# Patient Record
Sex: Female | Born: 1990 | Race: White | Hispanic: No | Marital: Married | Smoking: Never smoker
Health system: Southern US, Community
[De-identification: ages and names within clinical notes are randomized; demographics above are authoritative.]

## PROBLEM LIST (undated history)

## (undated) ENCOUNTER — Inpatient Hospital Stay (HOSPITAL_COMMUNITY): Payer: Self-pay

## (undated) DIAGNOSIS — E282 Polycystic ovarian syndrome: Secondary | ICD-10-CM

## (undated) DIAGNOSIS — F32A Depression, unspecified: Secondary | ICD-10-CM

## (undated) DIAGNOSIS — B009 Herpesviral infection, unspecified: Secondary | ICD-10-CM

## (undated) DIAGNOSIS — F329 Major depressive disorder, single episode, unspecified: Secondary | ICD-10-CM

## (undated) DIAGNOSIS — O2300 Infections of kidney in pregnancy, unspecified trimester: Secondary | ICD-10-CM

## (undated) DIAGNOSIS — Z8619 Personal history of other infectious and parasitic diseases: Secondary | ICD-10-CM

## (undated) HISTORY — DX: Polycystic ovarian syndrome: E28.2

## (undated) HISTORY — PX: WISDOM TOOTH EXTRACTION: SHX21

## (undated) HISTORY — DX: Personal history of other infectious and parasitic diseases: Z86.19

---
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
## 2011-04-18 LAB — RPR: RPR Ser Ql: NONREACTIVE

## 2011-04-18 LAB — BASIC METABOLIC PANEL
CO2: 24
Calcium: 9
Creatinine, Ser: 0.65
Glucose, Bld: 85

## 2011-04-18 LAB — CBC
MCV: 81.8
RBC: 4.4
WBC: 11

## 2011-04-18 LAB — DRUGS OF ABUSE SCREEN W/O ALC, ROUTINE URINE
Barbiturate Quant, Ur: NEGATIVE
Benzodiazepines.: NEGATIVE
Cocaine Metabolites: NEGATIVE
Methadone: NEGATIVE
Phencyclidine (PCP): NEGATIVE

## 2011-04-18 LAB — URINALYSIS, ROUTINE W REFLEX MICROSCOPIC
Bilirubin Urine: NEGATIVE
Glucose, UA: NEGATIVE
Hgb urine dipstick: NEGATIVE
Ketones, ur: NEGATIVE
pH: 7

## 2011-04-18 LAB — GAMMA GT: GGT: 15

## 2011-04-18 LAB — HEPATIC FUNCTION PANEL
ALT: 17
AST: 17
Bilirubin, Direct: 0.1
Total Protein: 7.1

## 2011-04-18 LAB — LIPID PANEL
HDL: 46
Total CHOL/HDL Ratio: 4
Triglycerides: 133
VLDL: 27

## 2011-04-18 LAB — DIFFERENTIAL
Eosinophils Absolute: 0.1
Lymphs Abs: 3.5
Monocytes Relative: 8
Neutrophils Relative %: 58

## 2011-04-18 LAB — URINE MICROSCOPIC-ADD ON

## 2011-04-19 LAB — URINALYSIS, ROUTINE W REFLEX MICROSCOPIC
Bilirubin Urine: NEGATIVE
Bilirubin Urine: NEGATIVE
Glucose, UA: NEGATIVE
Glucose, UA: NEGATIVE
Hgb urine dipstick: NEGATIVE
Hgb urine dipstick: NEGATIVE
Hgb urine dipstick: NEGATIVE
Protein, ur: NEGATIVE
Specific Gravity, Urine: 1.005
Specific Gravity, Urine: 1.015
Urobilinogen, UA: 0.2
pH: 7.5

## 2011-04-19 LAB — PROTIME-INR: Prothrombin Time: 13.3

## 2011-04-19 LAB — POCT I-STAT 3, ART BLOOD GAS (G3+)
O2 Saturation: 99
pCO2 arterial: 27.7 — ABNORMAL LOW
pH, Arterial: 7.46 — ABNORMAL HIGH
pO2, Arterial: 112 — ABNORMAL HIGH

## 2011-04-19 LAB — BASIC METABOLIC PANEL
BUN: 6
BUN: 9
CO2: 21
CO2: 25
CO2: 26
CO2: 26
Calcium: 8.2 — ABNORMAL LOW
Calcium: 8.2 — ABNORMAL LOW
Calcium: 8.4
Chloride: 108
Creatinine, Ser: 0.77
Creatinine, Ser: 0.77
Glucose, Bld: 105 — ABNORMAL HIGH
Glucose, Bld: 83
Glucose, Bld: 96
Glucose, Bld: 98
Potassium: 3.5
Potassium: 4.2
Sodium: 140
Sodium: 140
Sodium: 144

## 2011-04-19 LAB — RAPID URINE DRUG SCREEN, HOSP PERFORMED
Barbiturates: NOT DETECTED
Cocaine: NOT DETECTED
Opiates: NOT DETECTED
Tetrahydrocannabinol: NOT DETECTED

## 2011-04-19 LAB — COMPREHENSIVE METABOLIC PANEL
ALT: 25
AST: 29
Alkaline Phosphatase: 64
CO2: 22
Calcium: 8 — ABNORMAL LOW
Chloride: 111
Glucose, Bld: 102 — ABNORMAL HIGH
Potassium: 3.5
Sodium: 142

## 2011-04-19 LAB — DIFFERENTIAL
Basophils Relative: 0
Eosinophils Absolute: 0.1
Eosinophils Relative: 0
Monocytes Relative: 5
Neutrophils Relative %: 82 — ABNORMAL HIGH

## 2011-04-19 LAB — CBC
HCT: 37.8
MCHC: 34.8
MCV: 80.6
Platelets: 527 — ABNORMAL HIGH
RBC: 4.68

## 2011-04-19 LAB — URINE MICROSCOPIC-ADD ON

## 2011-04-19 LAB — SALICYLATE LEVEL
Salicylate Lvl: 31.8
Salicylate Lvl: 37.9

## 2011-04-19 LAB — POCT PREGNANCY, URINE: Preg Test, Ur: NEGATIVE

## 2011-04-19 LAB — MAGNESIUM: Magnesium: 2.4

## 2011-04-26 LAB — PREGNANCY, URINE: Preg Test, Ur: NEGATIVE

## 2011-04-26 LAB — DIFFERENTIAL
Basophils Absolute: 0
Basophils Relative: 0
Eosinophils Absolute: 0.1
Eosinophils Relative: 1
Monocytes Absolute: 0.5
Neutro Abs: 6.3

## 2011-04-26 LAB — CBC
HCT: 39
Hemoglobin: 13.3
Platelets: 456 — ABNORMAL HIGH
WBC: 9.1

## 2011-04-26 LAB — COMPREHENSIVE METABOLIC PANEL
ALT: 34
AST: 23
Albumin: 3.8
Alkaline Phosphatase: 68
BUN: 13
Chloride: 108
Potassium: 4
Sodium: 141
Total Bilirubin: 0.5

## 2011-04-26 LAB — ETHANOL: Alcohol, Ethyl (B): <5

## 2011-04-26 LAB — RAPID URINE DRUG SCREEN, HOSP PERFORMED: Benzodiazepines: NOT DETECTED

## 2011-06-27 LAB — OB RESULTS CONSOLE ANTIBODY SCREEN: Antibody Screen: NEGATIVE

## 2011-06-27 LAB — OB RESULTS CONSOLE RPR: RPR: NONREACTIVE

## 2011-06-27 LAB — OB RESULTS CONSOLE RUBELLA ANTIBODY, IGM: Rubella: IMMUNE

## 2011-06-27 LAB — OB RESULTS CONSOLE PLATELET COUNT: Platelets: 399 10*3/uL

## 2011-07-11 LAB — OB RESULTS CONSOLE GC/CHLAMYDIA
Chlamydia: NEGATIVE
Gonorrhea: NEGATIVE

## 2011-07-25 DIAGNOSIS — O2300 Infections of kidney in pregnancy, unspecified trimester: Secondary | ICD-10-CM

## 2011-07-25 HISTORY — DX: Infections of kidney in pregnancy, unspecified trimester: O23.00

## 2011-07-25 NOTE — L&D Delivery Note (Signed)
Delivery Note Pt pushed well with UCs.  At 0902 am  a viable female was delivered via  (Presentation:LOA;Vtx ). No nuchal cord.  No difficulty with shoulders.  Infant with spontaneous cry.  Infant dried, stimulated and placed on maternal chest.    APGAR: 9,9 ; weight pending .   Placenta status: intact.  Cord: 3VC with the following complications:none.  Cord pH: N/A  Anesthesia:Epidural   Episiotomy: None Lacerations: Bilateral labial abrasions, hemostatic Suture Repair: None Est. Blood Loss (mL): 250  Mom to postpartum.  Baby to nursery-stable.  Tonya Ellis O. 02/03/2012, 10:51 AM

## 2011-09-22 ENCOUNTER — Encounter (HOSPITAL_COMMUNITY): Payer: Self-pay | Admitting: *Deleted

## 2011-09-22 ENCOUNTER — Inpatient Hospital Stay (HOSPITAL_COMMUNITY)
Admission: AD | Admit: 2011-09-22 | Discharge: 2011-09-22 | Disposition: A | Discharge status: Home Health | Payer: Medicaid Other | Source: Ambulatory Visit | Attending: Obstetrics & Gynecology | Admitting: Obstetrics & Gynecology

## 2011-09-22 DIAGNOSIS — N949 Unspecified condition associated with female genital organs and menstrual cycle: Secondary | ICD-10-CM

## 2011-09-22 DIAGNOSIS — N760 Acute vaginitis: Secondary | ICD-10-CM

## 2011-09-22 DIAGNOSIS — O26899 Other specified pregnancy related conditions, unspecified trimester: Secondary | ICD-10-CM

## 2011-09-22 DIAGNOSIS — O99891 Other specified diseases and conditions complicating pregnancy: Secondary | ICD-10-CM | POA: Insufficient documentation

## 2011-09-22 DIAGNOSIS — R109 Unspecified abdominal pain: Secondary | ICD-10-CM

## 2011-09-22 LAB — URINALYSIS, ROUTINE W REFLEX MICROSCOPIC
Bilirubin Urine: NEGATIVE
Hgb urine dipstick: NEGATIVE
Ketones, ur: NEGATIVE mg/dL
Nitrite: NEGATIVE
Protein, ur: NEGATIVE mg/dL
Urobilinogen, UA: 0.2 mg/dL (ref 0.0–1.0)

## 2011-09-22 LAB — WET PREP, GENITAL

## 2011-09-22 MED ORDER — METRONIDAZOLE 500 MG PO TABS: 500.0000 mg | ORAL_TABLET | Freq: Two times a day (BID) | ORAL | Status: AC

## 2011-09-22 NOTE — Progress Notes (Signed)
Pt states, " I have had twinges in my lower abdomen for two days. Sometimes they stop me in my tracks."

## 2011-09-22 NOTE — Progress Notes (Signed)
Notified [facility], she states that she isn't their patient yet. Dr Joice Lofts notified of patient her complaints and her family request for ultrasound. Will see patient in MAU as soon as possible

## 2011-09-22 NOTE — ED Provider Notes (Signed)
History     Chief Complaint  Patient presents with  . Abdominal Cramping   HPI Patient is a 21 yo G1P0 at [redacted]w[redacted]d presenting to the MAU for cramps and twinges for 2 days. Last prenatal care in January in [place]. Has appointment for nurse appointment at [facility] on March 25. Cramps come on a random times, no set pattern. Has not had anything like this earlier in pregnancy. Describes cramping as squeezing pain. (Pain is less than her regular menstrual cramps) Dates established by ultrasound at 6 weeks. Regular prenatal care until she moved in January. No bleeding, no discharge, good fetal movement.   OB History    Grav Para Term Preterm Abortions TAB SAB Ect Mult Living   1              History reviewed. No pertinent past medical history.  History reviewed. No pertinent past surgical history.  History reviewed. No pertinent family history.  History  Substance Use Topics  . Smoking status: Passive Smoker  . Smokeless tobacco: Not on file  . Alcohol Use: No    Allergies: Not on File  Prescriptions prior to admission  Medication Sig Dispense Refill  . Prenatal Vit-Fe Fumarate-FA (PRENATAL MULTIVITAMIN) TABS Take 1 tablet by mouth daily.        Review of Systems  Constitutional: Negative for fever and chills.  Eyes: Negative for blurred vision.  Respiratory: Negative for shortness of breath.   Cardiovascular: Negative for chest pain.  Gastrointestinal: Negative for abdominal pain, diarrhea and constipation.  Genitourinary: Negative for dysuria.  Skin: Negative for rash.  Neurological: Positive for headaches.  Psychiatric/Behavioral: Negative for depression.   Physical Exam   Blood pressure 120/62, pulse 77, temperature 98.1 F (36.7 C), temperature source Oral, resp. rate 18, height 5\' 4"  (1.626 m), weight 88.451 kg (195 lb).  Physical Exam  Constitutional: She appears well-developed and well-nourished. No distress.  HENT:  Head: Normocephalic and atraumatic.  Eyes:  Pupils are equal, round, and reactive to light.  Neck: Normal range of motion.  Cardiovascular: Normal rate, regular rhythm and normal heart sounds.   Respiratory: Effort normal and breath sounds normal.  GI: Soft. There is no tenderness.       Gravid. Fundal Height 20cm  Genitourinary: Vagina normal.       Thick white discharge. No lesions noted.  Cervical exam: closed and thick  Musculoskeletal: Normal range of motion. She exhibits no edema and no tenderness.  Neurological: She is alert.  Skin: Skin is warm and dry.    MAU Course  Procedures  Results for orders placed during the hospital encounter of 09/22/11 (from the past 24 hour(s))  URINALYSIS, ROUTINE W REFLEX MICROSCOPIC     Status: Normal   Collection Time   09/22/11  7:57 PM      Component Value Range   Color, Urine YELLOW  YELLOW    APPearance CLEAR  CLEAR    Specific Gravity, Urine 1.015  1.005 - 1.030    pH 6.0  5.0 - 8.0    Glucose, UA NEGATIVE  NEGATIVE (mg/dL)   Hgb urine dipstick NEGATIVE  NEGATIVE    Bilirubin Urine NEGATIVE  NEGATIVE    Ketones, ur NEGATIVE  NEGATIVE (mg/dL)   Protein, ur NEGATIVE  NEGATIVE (mg/dL)   Urobilinogen, UA 0.2  0.0 - 1.0 (mg/dL)   Nitrite NEGATIVE  NEGATIVE    Leukocytes, UA NEGATIVE  NEGATIVE   WET PREP, GENITAL     Status: Abnormal  Collection Time   09/22/11 10:08 PM      Component Value Range   Yeast Wet Prep HPF POC NONE SEEN  NONE SEEN    Trich, Wet Prep NONE SEEN  NONE SEEN    Clue Cells Wet Prep HPF POC MODERATE (*) NONE SEEN    WBC, Wet Prep HPF POC FEW (*) NONE SEEN    FHT by doppler WNL Cervical exam: Closed/thick  Assessment and Plan  21 yo G1P0 at [redacted]w[redacted]d presenting for 2 day history of irregular cramping in lower abdomen - No distress, cramping not a regular pattern. FHT reassuring. Exam reassuring. - No sign of a UTI which would be causing her discomfort - Wet prep shows: Moderate Clue Cells - Given Rx for Flagyl 500mg  PO BID for 7 days for BV. - Patient will  follow up with [facility] on March 25, as scheduled.  - Will be discharged home with preterm labor precautions. Plan discussed, and patient examined with Dorathy Kinsman, CNM.  Yoanna Jurczyk 09/22/2011, 10:49 PM

## 2011-10-16 ENCOUNTER — Encounter (INDEPENDENT_AMBULATORY_CARE_PROVIDER_SITE_OTHER): Payer: Medicaid Other

## 2011-10-16 DIAGNOSIS — Z331 Pregnant state, incidental: Secondary | ICD-10-CM

## 2011-10-23 ENCOUNTER — Encounter (INDEPENDENT_AMBULATORY_CARE_PROVIDER_SITE_OTHER): Payer: Medicaid Other | Admitting: Registered Nurse

## 2011-10-23 ENCOUNTER — Encounter (INDEPENDENT_AMBULATORY_CARE_PROVIDER_SITE_OTHER): Payer: Medicaid Other

## 2011-10-23 DIAGNOSIS — Z1389 Encounter for screening for other disorder: Secondary | ICD-10-CM

## 2011-10-23 DIAGNOSIS — Z363 Encounter for antenatal screening for malformations: Secondary | ICD-10-CM

## 2011-10-23 DIAGNOSIS — Z331 Pregnant state, incidental: Secondary | ICD-10-CM

## 2011-10-26 ENCOUNTER — Encounter (INDEPENDENT_AMBULATORY_CARE_PROVIDER_SITE_OTHER): Payer: Medicaid Other | Admitting: Registered Nurse

## 2011-10-26 DIAGNOSIS — Z331 Pregnant state, incidental: Secondary | ICD-10-CM

## 2011-11-09 ENCOUNTER — Encounter (HOSPITAL_COMMUNITY): Payer: Self-pay

## 2011-11-09 ENCOUNTER — Telehealth: Payer: Self-pay | Admitting: Obstetrics and Gynecology

## 2011-11-09 ENCOUNTER — Inpatient Hospital Stay (HOSPITAL_COMMUNITY)
Admission: AD | Admit: 2011-11-09 | Discharge: 2011-11-09 | Disposition: A | Discharge status: Home / Self Care | Payer: Medicaid Other | Source: Ambulatory Visit | Attending: Obstetrics and Gynecology | Admitting: Obstetrics and Gynecology

## 2011-11-09 DIAGNOSIS — N898 Other specified noninflammatory disorders of vagina: Secondary | ICD-10-CM

## 2011-11-09 DIAGNOSIS — Z331 Pregnant state, incidental: Secondary | ICD-10-CM

## 2011-11-09 DIAGNOSIS — O99891 Other specified diseases and conditions complicating pregnancy: Secondary | ICD-10-CM | POA: Insufficient documentation

## 2011-11-09 DIAGNOSIS — O99340 Other mental disorders complicating pregnancy, unspecified trimester: Secondary | ICD-10-CM

## 2011-11-09 HISTORY — DX: Major depressive disorder, single episode, unspecified: F32.9

## 2011-11-09 HISTORY — DX: Depression, unspecified: F32.A

## 2011-11-09 LAB — URINALYSIS, ROUTINE W REFLEX MICROSCOPIC
Leukocytes, UA: NEGATIVE
Nitrite: NEGATIVE
Specific Gravity, Urine: 1.02 (ref 1.005–1.030)
Urobilinogen, UA: 0.2 mg/dL (ref 0.0–1.0)
pH: 6 (ref 5.0–8.0)

## 2011-11-09 LAB — WET PREP, GENITAL: Clue Cells Wet Prep HPF POC: NONE SEEN

## 2011-11-09 NOTE — MAU Note (Signed)
Mucous plug noted around1530-1600. Urge to pee since then, no bleeding, no leaking.

## 2011-11-09 NOTE — MAU Note (Signed)
Pt states last intercourse yesterday, noted mucusy vaginal d/c today at 1540. Denies vaginal bleeding. Having intermittent back pain x2 days.

## 2011-11-09 NOTE — MAU Provider Note (Signed)
History   Tonya Ellis is a 21y.o. WF at 27.2 weeks who presents for r/o ROM with CC of large amt of mucousy d/c noted around 1545 today.  Intercourse last night, but no lubricant used.  No VB in mucous or otherwise.  No irritation or odor to d/c.  Mild intermittent cramping a "few times" yesterday, but none today.  Denies any tightening or ctxs.  No UTI or PIH s/s.  Very active fetus.  No resp or GI c/o's.  BM's regular.  Not employed and not in school, and just sitting around at home today before coming to MAU w/ her mom and younger sister. Pt denies any gushes of fluid and did not wear a pad into MAU. Pregnancy r/f: 1.  Late to care 2.  Obese 3.  H/o bipolar--no meds currently  CSN: [id number]  Arrival date and time: 11/09/11 1631   First Provider Initiated Contact with Patient 11/09/11 1742      Chief Complaint  Patient presents with  . lost mucous    HPI  OB History    Grav Para Term Preterm Abortions TAB SAB Ect Mult Living   1               Past Medical History  Diagnosis Date  . Depression     No past surgical history on file.  Family History  Problem Relation Age of Onset  . Anesthesia problems Neg Hx     History  Substance Use Topics  . Smoking status: Passive Smoker  . Smokeless tobacco: Never Used  . Alcohol Use: No    Allergies: No Known Allergies  Prescriptions prior to admission  Medication Sig Dispense Refill  . Prenatal Vit-Fe Fumarate-FA (PRENATAL MULTIVITAMIN) TABS Take 1 tablet by mouth at bedtime.         ROS--see history above Physical Exam   Blood pressure 99/72, pulse 94, temperature 98 F (36.7 C), temperature source Oral, resp. rate 20, height 5\' 4"  (1.626 m), weight 195 lb 6.4 oz (88.633 kg). .. Results for orders placed during the hospital encounter of 11/09/11 (from the past 24 hour(s))  AMNISURE RUPTURE OF MEMBRANE (ROM)     Status: Normal   Collection Time   11/09/11  6:00 PM      Component Value Range   Amnisure ROM  NEGATIVE    WET PREP, GENITAL     Status: Abnormal   Collection Time   11/09/11  6:00 PM      Component Value Range   Yeast Wet Prep HPF POC NONE SEEN  NONE SEEN    Trich, Wet Prep NONE SEEN  NONE SEEN    Clue Cells Wet Prep HPF POC NONE SEEN  NONE SEEN    WBC, Wet Prep HPF POC FEW (*) NONE SEEN    Physical Exam  Constitutional: She is oriented to person, place, and time. She appears well-developed and well-nourished. No distress.       Hair dyed bright red; glasses  Cardiovascular: Normal rate.   Respiratory: Effort normal.  GI: Soft.       gravid  Genitourinary: Vagina normal.       Mons and labia--shaved; Perineum and vulva dry.  Neg pooling in vault. Cx:  Closed/long/high/medium firmness  Neurological: She is alert and oriented to person, place, and time.  Skin: Skin is warm and dry.    MAU Course  Procedures 1.  NST 2.  Wet prep 3.  Gc/ct (negative on 09/22/11) 4.  amnisure  Assessment and Plan  1.  IUP at 27.2 2.  mucousy d/c this afternoon; may be part of mucous plug or r/t intercourse last night 3.  No s/s of labor 4.  Reactive NST 5.  Negative amnisure 6.  Bipolar  1.  D/c home w/ PTL precautions 2.  F/u as previously scheduled or prn  [place] H 11/09/2011, 6:29 PM

## 2011-11-09 NOTE — Discharge Instructions (Signed)
Preventing Preterm Labor Preterm labor is when a pregnant woman has contractions that cause the cervix to open, shorten, and thin before 37 weeks of pregnancy. You will have regular contractions (tightening) 2 to 3 minutes apart. This usually causes discomfort or pain. HOME CARE  Eat a healthy diet.   Take your vitamins as told by your doctor.   Drink enough fluids to keep your pee (urine) clear or pale yellow every day.   Get rest and sleep.   Do not have sex if you are at high risk for preterm labor.   Follow your doctor's advice about activity, medicines, and tests.   Avoid stress.   Avoid hard labor or exercise that lasts for a long time.   Do not smoke.  GET HELP RIGHT AWAY IF:   You are having contractions.   You have belly (abdominal) pain.   You have bleeding from your vagina.   You have pain when you pee (urinate).   You have abnormal discharge from your vagina.   You have a temperature by mouth above 102 F (38.9 C).  MAKE SURE YOU:  Understand these instructions.   Will watch your condition.   Will get help if you are not doing well or get worse.  Document Released: 10/06/2008 Document Revised: 06/29/2011 Document Reviewed: 10/06/2008 [employer] Patient Information 2012 [employer]. 

## 2011-11-09 NOTE — Telephone Encounter (Signed)
Spoke with pt.   States just passed lg amt mucousy D/C.   No bleeding.  +FM.   Occ cont but unsure ho frequently.  Feels as though is leaking and underwear feels damp.  Consult w/[facility].   Pt instructed to go to MAU.  Pt verbalizes comprehension.

## 2011-11-09 NOTE — Telephone Encounter (Signed)
Routed to nancy °

## 2011-11-10 LAB — GC/CHLAMYDIA PROBE AMP, GENITAL: Chlamydia, DNA Probe: NEGATIVE

## 2011-11-14 ENCOUNTER — Ambulatory Visit (INDEPENDENT_AMBULATORY_CARE_PROVIDER_SITE_OTHER): Payer: Medicaid Other | Admitting: Obstetrics and Gynecology

## 2011-11-14 ENCOUNTER — Encounter: Payer: Self-pay | Admitting: Obstetrics and Gynecology

## 2011-11-14 ENCOUNTER — Other Ambulatory Visit: Payer: Medicaid Other

## 2011-11-14 ENCOUNTER — Telehealth: Payer: Self-pay | Admitting: Obstetrics and Gynecology

## 2011-11-14 VITALS — BP 100/54 | Wt 196.0 lb

## 2011-11-14 DIAGNOSIS — N949 Unspecified condition associated with female genital organs and menstrual cycle: Secondary | ICD-10-CM

## 2011-11-14 DIAGNOSIS — Z3400 Encounter for supervision of normal first pregnancy, unspecified trimester: Secondary | ICD-10-CM

## 2011-11-14 DIAGNOSIS — Z331 Pregnant state, incidental: Secondary | ICD-10-CM

## 2011-11-14 DIAGNOSIS — O26899 Other specified pregnancy related conditions, unspecified trimester: Secondary | ICD-10-CM

## 2011-11-14 DIAGNOSIS — N898 Other specified noninflammatory disorders of vagina: Secondary | ICD-10-CM

## 2011-11-14 DIAGNOSIS — R350 Frequency of micturition: Secondary | ICD-10-CM

## 2011-11-14 DIAGNOSIS — R102 Pelvic and perineal pain: Secondary | ICD-10-CM

## 2011-11-14 DIAGNOSIS — N390 Urinary tract infection, site not specified: Secondary | ICD-10-CM

## 2011-11-14 LAB — CBC
Platelets: 309 10*3/uL (ref 150–400)
RBC: 3.83 MIL/uL — ABNORMAL LOW (ref 3.87–5.11)
RDW: 13.2 % (ref 11.5–15.5)
WBC: 10.8 10*3/uL — ABNORMAL HIGH (ref 4.0–10.5)

## 2011-11-14 LAB — POCT URINALYSIS DIPSTICK
Ketones, UA: NEGATIVE
Spec Grav, UA: 1.015
Urobilinogen, UA: NEGATIVE
pH: 5

## 2011-11-14 LAB — GLUCOSE TOLERANCE, 1 HOUR: Glucose, 1 Hour GTT: 100 mg/dL (ref 70–140)

## 2011-11-14 MED ORDER — SULFAMETHOXAZOLE-TRIMETHOPRIM 800-160 MG PO TABS: 1.0000 | ORAL_TABLET | Freq: Two times a day (BID) | ORAL | Status: AC

## 2011-11-14 NOTE — Telephone Encounter (Signed)
Routed to chandra 

## 2011-11-14 NOTE — Patient Instructions (Signed)
Urinary Tract Infection in Pregnancy A urinary tract infection (UTI) is a bacterial infection of the urinary tract. Infection of the urinary tract can include the ureters, kidneys (pyelonephritis), bladder (cystitis), and urethra (urethritis). All pregnant women should be screened for bacteria in the urinary tract. Identifying and treating a UTI will decrease the risk of preterm labor and developing more serious infections in both the mother and baby. CAUSES Bacteria germs cause almost all UTIs. There are many factors that can increase your chances of getting a UTI during pregnancy. These include:  Having a short urethra.   Poor toilet and hygiene habits.   Sexual intercourse.   Blockage of urine along the urinary tract.   Problems with the pelvic muscles or nerves.   Diabetes.   Obesity.   Bladder problems after having several children.   Previous history of UTI.  SYMPTOMS   Pain, burning, or a stinging feeling when urinating.   Suddenly feeling the need to urinate right away (urgency).   Loss of bladder control (urinary incontinence).   Frequent urination, more than is common with pregnancy.   Lower abdominal or back discomfort.   Bad smelling urine.   Cloudy urine.   Blood in the urine (hematuria).   Fever.  When the kidneys are infected, the symptoms may be:  Back pain.   Flank pain on the right side more so than the left.   Fever.   Chills.   Nausea.   Vomiting.  DIAGNOSIS   Urine tests.   Additional tests and procedures may include:   Ultrasound of the kidneys, ureters, bladder, and urethra.   Looking in the bladder with a lighted tube (cystoscopy).   Certain X-ray studies only when absolutely necessary.  Finding out the results of your test Ask when your test results will be ready. Make sure you get your test results. TREATMENT  Antibiotic medicine by mouth.   Antibiotics given through the vein (intravenously), if needed.  HOME CARE  INSTRUCTIONS   Take your antibiotics as directed. Finish them even if you start to feel better. Only take medicine as directed by your caregiver.   Drink enough fluids to keep your urine clear or pale yellow.   Do not have sexual intercourse until the infection is gone and your caregiver says it is okay.   Make sure you are tested for UTIs throughout your pregnancy if you get one. These infections often come back.  Preventing a UTI in the future:  Practice good toilet habits. Always wipe from front to back. Use the tissue only once.   Do not hold your urine. Empty your bladder as soon as possible when the urge comes.   Do not douche or use deodorant sprays.   Wash with soap and warm water around the genital area and the anus.   Empty your bladder before and after sexual intercourse.   Wear underwear with a cotton crotch.   Avoid caffeine and carbonated drinks. They can irritate the bladder.   Drink cranberry juice or take cranberry pills. This may decrease the risk of getting a UTI.   Do not drink alcohol.   Keep all your appointments and tests as scheduled.  SEEK MEDICAL CARE IF:   Your symptoms get worse.   You are still having fevers 2 or more days after treatment begins.   You develop a rash.   You feel that you are having problems with medicines prescribed.   You develop abnormal vaginal discharge.  SEEK IMMEDIATE MEDICAL   CARE IF:   You develop back or flank pain.   You develop chills.   You have blood in your urine.   You develop nausea and vomiting.   You develop contractions of your uterus.   You have a gush of fluid from the vagina.  MAKE SURE YOU:   Understand these instructions.   Will watch your condition.   Will get help right away if you are not doing well or get worse.  Document Released: 11/04/2010 Document Revised: 06/29/2011 Document Reviewed: 11/04/2010 [employer] Patient Information 2012 [employer]. 

## 2011-11-14 NOTE — Progress Notes (Signed)
1) Pelvic pressure yesterday with urinary frequency since yesterday.  Some pink dc this am.  No bleeding.SSE shows no bleeding      Cx long and closed 2) Urinary frequency with U/A consistent with UTI.  Culture pending 3) 1hr glu today, hbg and RPR 4) Blood type Apos

## 2011-11-14 NOTE — Progress Notes (Signed)
Pt. Stated having pink discharge/ glucola today.

## 2011-11-15 LAB — RPR

## 2011-11-15 NOTE — Telephone Encounter (Signed)
TC TO PT PER TELEPHONE CALL. PT WANTS RESULTS OF URINE CULTURE FROM TODAY. TOLD PT TAKES APPROX 2-3 DAYS FOR CULTURE TO COME BACK. PT C/O PAIN WITH URINATION. CONSULTED WITH [facility], PT TO START SEPTRA. ADVISED PT TO INC WATER/CRANBERRY JUICE INTAKE. PT VOICES UNDERSTANDING. RX SENT TO PHARM BY [facility].

## 2011-11-17 LAB — CULTURE, OB URINE: Colony Count: >=100000

## 2011-11-28 ENCOUNTER — Ambulatory Visit (INDEPENDENT_AMBULATORY_CARE_PROVIDER_SITE_OTHER): Payer: Medicaid Other | Admitting: Obstetrics and Gynecology

## 2011-11-28 ENCOUNTER — Encounter: Payer: Self-pay | Admitting: Obstetrics and Gynecology

## 2011-11-28 VITALS — BP 100/60 | Wt 200.0 lb

## 2011-11-28 DIAGNOSIS — Z3400 Encounter for supervision of normal first pregnancy, unspecified trimester: Secondary | ICD-10-CM

## 2011-11-28 DIAGNOSIS — N390 Urinary tract infection, site not specified: Secondary | ICD-10-CM

## 2011-11-28 NOTE — Patient Instructions (Addendum)
List of [facility] Pediatricians    Contraception Choices Contraception (birth control) is the use of any methods or devices to prevent pregnancy. Below are some methods to help avoid pregnancy. HORMONAL METHODS   Contraceptive implant. This is a thin, plastic tube containing progesterone hormone. It does not contain estrogen hormone. Your caregiver inserts the tube in the inner part of the upper arm. The tube can remain in place for up to 3 years. After 3 years, the implant must be removed. The implant prevents the ovaries from releasing an egg (ovulation), thickens the cervical mucus which prevents sperm from entering the uterus, and thins the lining of the inside of the uterus.   Progesterone-only injections. These injections are given every 3 months by your caregiver to prevent pregnancy. This synthetic progesterone hormone stops the ovaries from releasing eggs. It also thickens cervical mucus and changes the uterine lining. This makes it harder for sperm to survive in the uterus.   Birth control pills. These pills contain estrogen and progesterone hormone. They work by stopping the egg from forming in the ovary (ovulation). Birth control pills are prescribed by a caregiver.Birth control pills can also be used to treat heavy periods.   Minipill. This type of birth control pill contains only the progesterone hormone. They are taken every day of each month and must be prescribed by your caregiver.   Birth control patch. The patch contains hormones similar to those in birth control pills. It must be changed once a week and is prescribed by a caregiver.   Vaginal ring. The ring contains hormones similar to those in birth control pills. It is left in the vagina for 3 weeks, removed for 1 week, and then a new one is put back in place. The patient must be comfortable inserting and removing the ring from the vagina.A caregiver's prescription is necessary.   Emergency contraception. Emergency  contraceptives prevent pregnancy after unprotected sexual intercourse. This pill can be taken right after sex or up to 5 days after unprotected sex. It is most effective the sooner you take the pills after having sexual intercourse. Emergency contraceptive pills are available without a prescription. Check with your pharmacist. Do not use emergency contraception as your only form of birth control.  BARRIER METHODS   Female condom. This is a thin sheath (latex or rubber) that is worn over the penis during sexual intercourse. It can be used with spermicide to increase effectiveness.   Female condom. This is a soft, loose-fitting sheath that is put into the vagina before sexual intercourse.   Diaphragm. This is a soft, latex, dome-shaped barrier that must be fitted by a caregiver. It is inserted into the vagina, along with a spermicidal jelly. It is inserted before intercourse. The diaphragm should be left in the vagina for 6 to 8 hours after intercourse.   Cervical cap. This is a round, soft, latex or plastic cup that fits over the cervix and must be fitted by a caregiver. The cap can be left in place for up to 48 hours after intercourse.   Sponge. This is a soft, circular piece of polyurethane foam. The sponge has spermicide in it. It is inserted into the vagina after wetting it and before sexual intercourse.   Spermicides. These are chemicals that kill or block sperm from entering the cervix and uterus. They come in the form of creams, jellies, suppositories, foam, or tablets. They do not require a prescription. They are inserted into the vagina with an applicator before  having sexual intercourse. The process must be repeated every time you have sexual intercourse.  INTRAUTERINE CONTRACEPTION  Intrauterine device (IUD). This is a T-shaped device that is put in a woman's uterus during a menstrual period to prevent pregnancy. There are 2 types:   Copper IUD. This type of IUD is wrapped in copper wire  and is placed inside the uterus. Copper makes the uterus and fallopian tubes produce a fluid that kills sperm. It can stay in place for 10 years.   Hormone IUD. This type of IUD contains the hormone progestin (synthetic progesterone). The hormone thickens the cervical mucus and prevents sperm from entering the uterus, and it also thins the uterine lining to prevent implantation of a fertilized egg. The hormone can weaken or kill the sperm that get into the uterus. It can stay in place for 5 years.  PERMANENT METHODS OF CONTRACEPTION  Female tubal ligation. This is when the woman's fallopian tubes are surgically sealed, tied, or blocked to prevent the egg from traveling to the uterus.   Female sterilization. This is when the female has the tubes that carry sperm tied off (vasectomy).This blocks sperm from entering the vagina during sexual intercourse. After the procedure, the man can still ejaculate fluid (semen).  NATURAL PLANNING METHODS  Natural family planning. This is not having sexual intercourse or using a barrier method (condom, diaphragm, cervical cap) on days the woman could become pregnant.   Calendar method. This is keeping track of the length of each menstrual cycle and identifying when you are fertile.   Ovulation method. This is avoiding sexual intercourse during ovulation.   Symptothermal method. This is avoiding sexual intercourse during ovulation, using a thermometer and ovulation symptoms.   Post-ovulation method. This is timing sexual intercourse after you have ovulated.  Regardless of which type or method of contraception you choose, it is important that you use condoms to protect against the transmission of sexually transmitted diseases (STDs). Talk with your caregiver about which form of contraception is most appropriate for you. Document Released: 07/10/2005 Document Revised: 06/29/2011 Document Reviewed: 11/16/2010 [facility] Patient Information 2012 [place], [place].

## 2011-11-28 NOTE — Progress Notes (Signed)
All UTI sx resolved, all antibiotics taken. TOC urine C&S today 1 hr glu=100 Hgb=11 RPR neg

## 2011-12-15 ENCOUNTER — Encounter: Payer: Self-pay | Admitting: Obstetrics and Gynecology

## 2011-12-15 ENCOUNTER — Ambulatory Visit (INDEPENDENT_AMBULATORY_CARE_PROVIDER_SITE_OTHER): Payer: Medicaid Other | Admitting: Obstetrics and Gynecology

## 2011-12-15 VITALS — BP 110/70 | Wt 200.0 lb

## 2011-12-15 DIAGNOSIS — Z8742 Personal history of other diseases of the female genital tract: Secondary | ICD-10-CM

## 2011-12-15 DIAGNOSIS — B009 Herpesviral infection, unspecified: Secondary | ICD-10-CM

## 2011-12-15 DIAGNOSIS — F319 Bipolar disorder, unspecified: Secondary | ICD-10-CM

## 2011-12-15 DIAGNOSIS — O0930 Supervision of pregnancy with insufficient antenatal care, unspecified trimester: Secondary | ICD-10-CM

## 2011-12-15 NOTE — Progress Notes (Signed)
No concerns today 

## 2011-12-15 NOTE — Patient Instructions (Signed)
Fetal Movement Counts Patient Name: __________________________________________________ Patient Due Date: ____________________ Kick counts is highly recommended in high risk pregnancies, but it is a good idea for every pregnant woman to do. Start counting fetal movements at 28 weeks of the pregnancy. Fetal movements increase after eating a full meal or eating or drinking something sweet (the blood sugar is higher). It is also important to drink plenty of fluids (well hydrated) before doing the count. Lie on your left side because it helps with the circulation or you can sit in a comfortable chair with your arms over your belly (abdomen) with no distractions around you. DOING THE COUNT  Try to do the count the same time of day each time you do it.   Mark the day and time, then see how long it takes for you to feel 10 movements (kicks, flutters, swishes, rolls). You should have at least 10 movements within 2 hours. You will most likely feel 10 movements in much less than 2 hours. If you do not, wait an hour and count again. After a couple of days you will see a pattern.   What you are looking for is a change in the pattern or not enough counts in 2 hours. Is it taking longer in time to reach 10 movements?  SEEK MEDICAL CARE IF:  You feel less than 10 counts in 2 hours. Tried twice.   No movement in one hour.   The pattern is changing or taking longer each day to reach 10 counts in 2 hours.   You feel the baby is not moving as it usually does.  Date: ____________ Movements: ____________ Start time: ____________ Finish time: ____________  Date: ____________ Movements: ____________ Start time: ____________ Finish time: ____________ Date: ____________ Movements: ____________ Start time: ____________ Finish time: ____________ Date: ____________ Movements: ____________ Start time: ____________ Finish time: ____________ Date: ____________ Movements: ____________ Start time: ____________ Finish time:  ____________ Date: ____________ Movements: ____________ Start time: ____________ Finish time: ____________ Date: ____________ Movements: ____________ Start time: ____________ Finish time: ____________ Date: ____________ Movements: ____________ Start time: ____________ Finish time: ____________  Date: ____________ Movements: ____________ Start time: ____________ Finish time: ____________ Date: ____________ Movements: ____________ Start time: ____________ Finish time: ____________ Date: ____________ Movements: ____________ Start time: ____________ Finish time: ____________ Date: ____________ Movements: ____________ Start time: ____________ Finish time: ____________ Date: ____________ Movements: ____________ Start time: ____________ Finish time: ____________ Date: ____________ Movements: ____________ Start time: ____________ Finish time: ____________ Date: ____________ Movements: ____________ Start time: ____________ Finish time: ____________  Date: ____________ Movements: ____________ Start time: ____________ Finish time: ____________ Date: ____________ Movements: ____________ Start time: ____________ Finish time: ____________ Date: ____________ Movements: ____________ Start time: ____________ Finish time: ____________ Date: ____________ Movements: ____________ Start time: ____________ Finish time: ____________ Date: ____________ Movements: ____________ Start time: ____________ Finish time: ____________ Date: ____________ Movements: ____________ Start time: ____________ Finish time: ____________ Date: ____________ Movements: ____________ Start time: ____________ Finish time: ____________  Date: ____________ Movements: ____________ Start time: ____________ Finish time: ____________ Date: ____________ Movements: ____________ Start time: ____________ Finish time: ____________ Date: ____________ Movements: ____________ Start time: ____________ Finish time: ____________ Date: ____________ Movements:  ____________ Start time: ____________ Finish time: ____________ Date: ____________ Movements: ____________ Start time: ____________ Finish time: ____________ Date: ____________ Movements: ____________ Start time: ____________ Finish time: ____________ Date: ____________ Movements: ____________ Start time: ____________ Finish time: ____________  Date: ____________ Movements: ____________ Start time: ____________ Finish time: ____________ Date: ____________ Movements: ____________ Start time: ____________ Finish time: ____________ Date: ____________ Movements: ____________ Start time:   ____________ Finish time: ____________ Date: ____________ Movements: ____________ Start time: ____________ Finish time: ____________ Date: ____________ Movements: ____________ Start time: ____________ Finish time: ____________ Date: ____________ Movements: ____________ Start time: ____________ Finish time: ____________ Date: ____________ Movements: ____________ Start time: ____________ Finish time: ____________  Date: ____________ Movements: ____________ Start time: ____________ Finish time: ____________ Date: ____________ Movements: ____________ Start time: ____________ Finish time: ____________ Date: ____________ Movements: ____________ Start time: ____________ Finish time: ____________ Date: ____________ Movements: ____________ Start time: ____________ Finish time: ____________ Date: ____________ Movements: ____________ Start time: ____________ Finish time: ____________ Date: ____________ Movements: ____________ Start time: ____________ Finish time: ____________ Date: ____________ Movements: ____________ Start time: ____________ Finish time: ____________  Date: ____________ Movements: ____________ Start time: ____________ Finish time: ____________ Date: ____________ Movements: ____________ Start time: ____________ Finish time: ____________ Date: ____________ Movements: ____________ Start time: ____________ Finish  time: ____________ Date: ____________ Movements: ____________ Start time: ____________ Finish time: ____________ Date: ____________ Movements: ____________ Start time: ____________ Finish time: ____________ Date: ____________ Movements: ____________ Start time: ____________ Finish time: ____________ Date: ____________ Movements: ____________ Start time: ____________ Finish time: ____________  Date: ____________ Movements: ____________ Start time: ____________ Finish time: ____________ Date: ____________ Movements: ____________ Start time: ____________ Finish time: ____________ Date: ____________ Movements: ____________ Start time: ____________ Finish time: ____________ Date: ____________ Movements: ____________ Start time: ____________ Finish time: ____________ Date: ____________ Movements: ____________ Start time: ____________ Finish time: ____________ Date: ____________ Movements: ____________ Start time: ____________ Finish time: ____________ Document Released: 08/09/2006 Document Revised: 06/29/2011 Document Reviewed: 02/09/2009 [employer] Patient Information 2012 [employer].  Preventing Preterm Labor Preterm labor is when a pregnant woman has contractions that cause the cervix to open, shorten, and thin before 37 weeks of pregnancy. You will have regular contractions (tightening) 2 to 3 minutes apart. This usually causes discomfort or pain. HOME CARE  Eat a healthy diet.   Take your vitamins as told by your doctor.   Drink enough fluids to keep your pee (urine) clear or pale yellow every day.   Get rest and sleep.   Do not have sex if you are at high risk for preterm labor.   Follow your doctor's advice about activity, medicines, and tests.   Avoid stress.   Avoid hard labor or exercise that lasts for a long time.   Do not smoke.  GET HELP RIGHT AWAY IF:   You are having contractions.   You have belly (abdominal) pain.   You have bleeding from your vagina.   You  have pain when you pee (urinate).   You have abnormal discharge from your vagina.   You have a temperature by mouth above 102 F (38.9 C).  MAKE SURE YOU:  Understand these instructions.   Will watch your condition.   Will get help if you are not doing well or get worse.  Document Released: 10/06/2008 Document Revised: 06/29/2011 Document Reviewed: 10/06/2008 [employer] Patient Information 2012 [employer]. 

## 2011-12-15 NOTE — Progress Notes (Signed)
[redacted]w[redacted]d UA CX neg rvd FKC and labor PTL sx's Pt was feeling faint/sweating - had not eaten felt better after food

## 2011-12-22 ENCOUNTER — Inpatient Hospital Stay (HOSPITAL_COMMUNITY): Payer: Medicaid Other

## 2011-12-22 ENCOUNTER — Inpatient Hospital Stay (HOSPITAL_COMMUNITY)
Admission: AD | Admit: 2011-12-22 | Discharge: 2011-12-22 | Disposition: A | Discharge status: Home / Self Care | Payer: Medicaid Other | Source: Ambulatory Visit | Attending: Obstetrics and Gynecology | Admitting: Obstetrics and Gynecology

## 2011-12-22 ENCOUNTER — Encounter (HOSPITAL_COMMUNITY): Payer: Self-pay | Admitting: *Deleted

## 2011-12-22 DIAGNOSIS — Z3400 Encounter for supervision of normal first pregnancy, unspecified trimester: Secondary | ICD-10-CM

## 2011-12-22 DIAGNOSIS — O2300 Infections of kidney in pregnancy, unspecified trimester: Secondary | ICD-10-CM | POA: Diagnosis present

## 2011-12-22 DIAGNOSIS — F319 Bipolar disorder, unspecified: Secondary | ICD-10-CM

## 2011-12-22 DIAGNOSIS — O2390 Unspecified genitourinary tract infection in pregnancy, unspecified trimester: Secondary | ICD-10-CM

## 2011-12-22 DIAGNOSIS — O99891 Other specified diseases and conditions complicating pregnancy: Secondary | ICD-10-CM | POA: Insufficient documentation

## 2011-12-22 DIAGNOSIS — O4700 False labor before 37 completed weeks of gestation, unspecified trimester: Secondary | ICD-10-CM | POA: Insufficient documentation

## 2011-12-22 DIAGNOSIS — O4693 Antepartum hemorrhage, unspecified, third trimester: Secondary | ICD-10-CM

## 2011-12-22 LAB — URINALYSIS, ROUTINE W REFLEX MICROSCOPIC
Ketones, ur: 15 mg/dL — AB
Nitrite: POSITIVE — AB
Protein, ur: >300 mg/dL — AB
pH: 5 (ref 5.0–8.0)

## 2011-12-22 LAB — WET PREP, GENITAL
Trich, Wet Prep: NONE SEEN
Yeast Wet Prep HPF POC: NONE SEEN

## 2011-12-22 LAB — CBC
Hemoglobin: 12 g/dL (ref 12.0–15.0)
MCH: 28 pg (ref 26.0–34.0)
MCV: 83.2 fL (ref 78.0–100.0)
RBC: 4.28 MIL/uL (ref 3.87–5.11)

## 2011-12-22 MED ORDER — NIFEDIPINE 10 MG PO CAPS
10.0000 mg | ORAL_CAPSULE | ORAL | Status: AC
  Administered 2011-12-22 (×3): 10 mg via ORAL
  Filled 2011-12-22 (×3): qty 1

## 2011-12-22 MED ORDER — CEFAZOLIN SODIUM-DEXTROSE 2-3 GM-% IV SOLR
2.0000 g | Freq: Once | INTRAVENOUS | Status: AC
  Administered 2011-12-22: 2 g via INTRAVENOUS
  Filled 2011-12-22: qty 50

## 2011-12-22 MED ORDER — SODIUM CHLORIDE 0.9 % IJ SOLN
INTRAMUSCULAR | Status: AC
  Administered 2011-12-22: 3 mL
  Filled 2011-12-22: qty 3

## 2011-12-22 MED ORDER — HYDROCODONE-ACETAMINOPHEN 5-325 MG PO TABS: 1.0000 | ORAL_TABLET | ORAL | Status: AC | PRN

## 2011-12-22 MED ORDER — CEPHALEXIN 500 MG PO CAPS: 500.0000 mg | ORAL_CAPSULE | Freq: Three times a day (TID) | ORAL | Status: AC

## 2011-12-22 MED ORDER — LACTATED RINGERS IV BOLUS (SEPSIS)
1000.0000 mL | Freq: Once | INTRAVENOUS | Status: AC
  Administered 2011-12-22: 1000 mL via INTRAVENOUS

## 2011-12-22 MED ORDER — LACTATED RINGERS IV SOLN: INTRAVENOUS | Status: DC

## 2011-12-22 MED ORDER — HYDROCODONE-ACETAMINOPHEN 5-325 MG PO TABS
1.0000 | ORAL_TABLET | ORAL | Status: DC | PRN
  Administered 2011-12-22: 1 via ORAL
  Filled 2011-12-22: qty 1

## 2011-12-22 NOTE — MAU Note (Signed)
PANTIES WORN IN - 1 LIGHT RED DOT  WITH YELLOWISH D/C.

## 2011-12-22 NOTE — MAU Note (Signed)
PT SAYS  WAS LIVING IN [place]- BEFORE  FEB.   HAD PNC  THERE.   WENT [facility].    LAST VISIT WAS 5-24.   PT SAYS AT 0320- AWOKE- WENT TO B-ROOM- HAD SPOTS OF BLEEDING.  THEN WENT BACK TO B-ROOM- SAW BLEEDING LIKE LIGHT PERIOD.   SHE HAD CRAMPING AND PAIN  IN LOWER ABD .  ON ARRIVAL-  BROUGHT FROM LOBBY TO RM 5 VIA W/C- PT SAID SHE WAS GUSHING BLOOD IN LOBBY.  IN RM 5- NO BLOOD ON PERINEUM.   PLACED ON MONITOR.

## 2011-12-22 NOTE — MAU Provider Note (Signed)
Returned from US--now reports pain more focused in lower back.  No bleeding. Grimacing with FM and colicky back pain.  Filed Vitals:   12/22/11 0440 12/22/11 0725 12/22/11 0834  BP: 117/77 112/65   Pulse: 103 82   Temp: 97.1 F (36.2 C)  97.2 F (36.2 C)  TempSrc: Oral  Oral  Resp: 24 20   Height: 5\' 4"  (1.626 m)    Weight: 200 lb (90.719 kg)     FHR reactive, no decels Toco--uterine irritability noted.  [place]: Vtx, AFI 12.33 cm, 36%ile.  EFW 2126 gm, 4+11, 52%ile.        Placenta anterior, above os.  No evidence of placental abruption.        Cervix 2.9.  Assessment: Probable pyelo, in light of elevated WBC ct and UA results (blood, protein, nitrites)--currently afebrile. No evidence vaginal bleeding Uterine irritability, negative FFN. Cervical length slightly short  Plan: Consulted with Dr. Stefano Gaul Plan single dose IV Ancef 2 gm in MAU now. Will give Vicodin for pain and observe status. If d/c'd home, plan Keflex 500 mg po TID x 7 days Plan MAU recheck of CBC and status tomorrow.  Nigel Bridgeman, CNM, [place] 12/22/11 8:50am  Addendum: Feeling better after Vicodin, IV fluids, and IV Ancef.  No contractions.  Awaiting breakfast. Only pain is discomfort from hard bed.  FHR reactive, no decels. No UCs.  Filed Vitals:   12/22/11 0440 12/22/11 0725 12/22/11 0834 12/22/11 1025  BP: 117/77 112/65  95/55  Pulse: 103 82  78  Temp: 97.1 F (36.2 C)  97.2 F (36.2 C)   TempSrc: Oral  Oral   Resp: 24 20  18   Height: 5\' 4"  (1.626 m)     Weight: 200 lb (90.719 kg)      Will d/c home for regimen of outpatient treatment for pyelo.  Patient lives with mother and sister, does not work. Rx Keflex 500 mg po TID x 7 days, Vicodin 1-2 po q 4-6 hours prn pain. Will return to MAU 6/1 for repeat CBC and re-check of temperature and status.  Has ROB appointment on 6/6. Precautions reviewed.  To call with increased temp or worsening of symptoms.  Nigel Bridgeman, CNM, [place] 12/22/11 11a

## 2011-12-22 NOTE — Discharge Instructions (Signed)
Pyelonephritis, Adult Pyelonephritis is a kidney infection. In general, there are 2 main types of pyelonephritis:  Infections that come on quickly without any warning (acute pyelonephritis).   Infections that persist for a long period of time (chronic pyelonephritis).  CAUSES  Two main causes of pyelonephritis are:  Bacteria traveling from the bladder to the kidney. This is a problem especially in pregnant women. The urine in the bladder can become filled with bacteria from multiple causes, including:   Inflammation of the prostate gland (prostatitis).   Sexual intercourse in females.   Bladder infection (cystitis).   Bacteria traveling from the bloodstream to the tissue part of the kidney.  Problems that may increase your risk of getting a kidney infection include:  Diabetes.   Kidney stones or bladder stones.   Cancer.   Catheters placed in the bladder.   Other abnormalities of the kidney or ureter.  SYMPTOMS   Abdominal pain.   Pain in the side or flank area.   Fever.   Chills.   Upset stomach.   Blood in the urine (dark urine).   Frequent urination.   Strong or persistent urge to urinate.   Burning or stinging when urinating.  DIAGNOSIS  Your caregiver may diagnose your kidney infection based on your symptoms. A urine sample may also be taken. TREATMENT  In general, treatment depends on how severe the infection is.   If the infection is mild and caught early, your caregiver may treat you with oral antibiotics and send you home.   If the infection is more severe, the bacteria may have gotten into the bloodstream. This will require intravenous (IV) antibiotics and a hospital stay. Symptoms may include:   High fever.   Severe flank pain.   Shaking chills.   Even after a hospital stay, your caregiver may require you to be on oral antibiotics for a period of time.   Other treatments may be required depending upon the cause of the infection.  HOME CARE  INSTRUCTIONS   Take your antibiotics as directed. Finish them even if you start to feel better.   Make an appointment to have your urine checked to make sure the infection is gone.   Drink enough fluids to keep your urine clear or pale yellow.   Take medicines for the bladder if you have urgency and frequency of urination as directed by your caregiver.  SEEK IMMEDIATE MEDICAL CARE IF:   You have a fever.   You are unable to take your antibiotics or fluids.   You develop shaking chills.   You experience extreme weakness or fainting.   There is no improvement after 2 days of treatment.  MAKE SURE YOU:  Understand these instructions.   Will watch your condition.   Will get help right away if you are not doing well or get worse.  Document Released: 07/10/2005 Document Revised: 06/29/2011 Document Reviewed: 12/14/2010 Exit[employer] Patient Information 2012 [employer]. 

## 2011-12-22 NOTE — MAU Provider Note (Signed)
History     CSN: [id number]  Arrival date and time: 12/22/11 0432   None     No chief complaint on file.  HPI Comments: Pt is a 21yo G1P0 at [redacted]w[redacted]d that arrives unannounced w cc of pelvic pain/creamping and vaginal bleeding that started about 3am. She reports a large gush of blood and felt as if "something was coming out" so she was immediately brought back to a room.  Pregnancy has been unremarkable, tho she does have a history of bipolar - unmedicated x2 years.  She did state she felt like a yeast infection and was using and OTC cream last 2 days.       Past Medical History  Diagnosis Date  . H/O varicella   . Bipolar disorder   . Depression     bipolar, no meds    History reviewed. No pertinent past surgical history.  Family History  Problem Relation Age of Onset  . Anesthesia problems Neg Hx   . Mental illness Mother   . Drug abuse Father   . Cancer Maternal Grandmother     breast   . Mental illness Maternal Grandmother   . Diabetes Paternal Grandfather     History  Substance Use Topics  . Smoking status: Passive Smoker  . Smokeless tobacco: Never Used  . Alcohol Use: No    Allergies: No Known Allergies  Prescriptions prior to admission  Medication Sig Dispense Refill  . Prenatal Vit-Fe Fumarate-FA (PRENATAL MULTIVITAMIN) TABS Take 1 tablet by mouth at bedtime.         Review of Systems  Gastrointestinal: Positive for abdominal pain.       Creamping/pelvic pain, difficulty describing  suprapubically feels constant at times, other interimittent, just feels "pain"   Genitourinary:       Vaginal bleeding   Psychiatric/Behavioral: The patient is nervous/anxious.    Physical Exam   Blood pressure 117/77, pulse 103, temperature 97.1 F (36.2 C), temperature source Oral, resp. rate 24, height 5\' 4"  (1.626 m), weight 200 lb (90.719 kg), last menstrual period 03/19/2011.  Physical Exam  Constitutional: She is oriented to person, place, and time. She  appears well-developed and well-nourished. She appears distressed.       Crying appears anxious and worried  HENT:  Head: Normocephalic.  Neck: Normal range of motion.  Cardiovascular: Normal rate, regular rhythm and normal heart sounds.   Respiratory: Effort normal and breath sounds normal.  GI: Soft. Bowel sounds are normal.  Genitourinary: Vagina normal. No vaginal discharge found.       chux pad and sm area of mucousy, blood tinged area Then before pt used BR, larger amt of watery d/c noted  Spec exam, some watery d/c, but no pooling and fern neg No blood in vault  VE=cl/th/high, vtx  Musculoskeletal: Normal range of motion. She exhibits no edema.  Neurological: She is alert and oriented to person, place, and time.  Skin: Skin is warm and dry.  Psychiatric: She has a normal mood and affect. Her behavior is normal.    MAU Course  Procedures   Assessment and Plan  IUP at [redacted]w[redacted]d ? PTL/abruption? SROM? Wet prep, amnisure, gc/ct, gbs, FFN, all pending UA pending  PO fluids given and procardia PO Will observe closely   Robertlee Rogacki M 12/22/2011, 5:33 AM   Addendum: at 4098 Pt more calm now Results for orders placed during the hospital encounter of 12/22/11 (from the past 24 hour(s))  WET PREP, GENITAL     Status:  Abnormal   Collection Time   12/22/11  5:30 AM      Component Value Range   Yeast Wet Prep HPF POC NONE SEEN  NONE SEEN    Trich, Wet Prep NONE SEEN  NONE SEEN    Clue Cells Wet Prep HPF POC NONE SEEN  NONE SEEN    WBC, Wet Prep HPF POC FEW (*) NONE SEEN   AMNISURE RUPTURE OF MEMBRANE (ROM)     Status: Normal   Collection Time   12/22/11  5:30 AM      Component Value Range   Amnisure ROM NEGATIVE    URINALYSIS, ROUTINE W REFLEX MICROSCOPIC     Status: Abnormal   Collection Time   12/22/11  5:35 AM      Component Value Range   Color, Urine RED (*) YELLOW    APPearance CLOUDY (*) CLEAR    Specific Gravity, Urine 1.020  1.005 - 1.030    pH 5.0  5.0 - 8.0     Glucose, UA NEGATIVE  NEGATIVE (mg/dL)   Hgb urine dipstick LARGE (*) NEGATIVE    Bilirubin Urine NEGATIVE  NEGATIVE    Ketones, ur 15 (*) NEGATIVE (mg/dL)   Protein, ur >469 (*) NEGATIVE (mg/dL)   Urobilinogen, UA 2.0 (*) 0.0 - 1.0 (mg/dL)   Nitrite POSITIVE (*) NEGATIVE    Leukocytes, UA MODERATE (*) NEGATIVE   URINE MICROSCOPIC-ADD ON     Status: Abnormal   Collection Time   12/22/11  5:35 AM      Component Value Range   Squamous Epithelial / LPF FEW (*) RARE    WBC, UA 3-6  <3 (WBC/hpf)   RBC / HPF TOO NUMEROUS TO COUNT  <3 (RBC/hpf)   Bacteria, UA FEW (*) RARE     ? UTI Will give IVF's bolus and check [place] for growth/AFI/abruption FHR reassuring at 130 reactive, CAT 1 toco frequent irritability   CTO closely

## 2011-12-22 NOTE — MAU Note (Signed)
Lab notified of urine culture order, spec in lab

## 2011-12-22 NOTE — MAU Note (Signed)
Returned from [place], states pain is mainly in back at this point. No active bleeding noted.  States when feels pressure like really needs to pee, that is when she has the bleeding.

## 2011-12-22 NOTE — MAU Note (Signed)
Tolerated food, no nausea.

## 2011-12-23 ENCOUNTER — Encounter (HOSPITAL_COMMUNITY): Payer: Self-pay | Admitting: *Deleted

## 2011-12-23 ENCOUNTER — Inpatient Hospital Stay (HOSPITAL_COMMUNITY)
Admission: AD | Admit: 2011-12-23 | Discharge: 2011-12-23 | Disposition: A | Discharge status: Home / Self Care | Payer: Medicaid Other | Source: Ambulatory Visit | Attending: Obstetrics and Gynecology | Admitting: Obstetrics and Gynecology

## 2011-12-23 DIAGNOSIS — O2390 Unspecified genitourinary tract infection in pregnancy, unspecified trimester: Secondary | ICD-10-CM | POA: Insufficient documentation

## 2011-12-23 DIAGNOSIS — D72828 Other elevated white blood cell count: Secondary | ICD-10-CM

## 2011-12-23 DIAGNOSIS — N12 Tubulo-interstitial nephritis, not specified as acute or chronic: Secondary | ICD-10-CM | POA: Insufficient documentation

## 2011-12-23 LAB — DIFFERENTIAL
Basophils Absolute: 0 10*3/uL (ref 0.0–0.1)
Basophils Relative: 0 % (ref 0–1)
Lymphocytes Relative: 13 % (ref 12–46)
Monocytes Absolute: 0.8 10*3/uL (ref 0.1–1.0)
Monocytes Relative: 6 % (ref 3–12)
Neutro Abs: 11.7 10*3/uL — ABNORMAL HIGH (ref 1.7–7.7)
Neutrophils Relative %: 81 % — ABNORMAL HIGH (ref 43–77)

## 2011-12-23 LAB — CBC
HCT: 33.7 % — ABNORMAL LOW (ref 36.0–46.0)
Hemoglobin: 11.3 g/dL — ABNORMAL LOW (ref 12.0–15.0)
MCHC: 33.5 g/dL (ref 30.0–36.0)
RBC: 4 MIL/uL (ref 3.87–5.11)
WBC: 14.5 10*3/uL — ABNORMAL HIGH (ref 4.0–10.5)

## 2011-12-23 NOTE — MAU Note (Signed)
Patient being treated for kidney infection 12 w4d here for blood drawn and evaluation

## 2011-12-23 NOTE — MAU Provider Note (Signed)
History   21 yo G1P0 at 28 4/7 weeks presented for follow-up CBC/diff, temp check, and re-evaluation of status following dx of pyelonephritis yesterday in MAU.  She had presented with back and lower abdominal pain, but had no fever.  Her WBC count was 22.5, with positive UA.  She received a dose of Ancef 2 gm IV, and was Rx'd Keflex 500 mg po TID x 7 days.  Subsequent urine culture showed Ecoli > 100,000 colonies.  Had some uterine irritability, with negative Amnisure, negative FFN, cervical length 2.9, with cervix long and closed by exam.  Upon presentation today, she reports she feels much better, with resolution of back and abdominal pain.  She has a little pressure with urination, but no urgency or frequency.  Pregnancy remarkable for: Bipolar disorder, no meds Depression Pyelonephritis  Chief Complaint  Patient presents with  . Pyelonephritis     OB History    Grav Para Term Preterm Abortions TAB SAB Ect Mult Living   1 0 0 0 0 0 0 0 0 0       Past Medical History  Diagnosis Date  . H/O varicella   . Bipolar disorder   . Depression     bipolar, no meds    History reviewed. No pertinent past surgical history.  Family History  Problem Relation Age of Onset  . Anesthesia problems Neg Hx   . Mental illness Mother   . Drug abuse Father   . Cancer Maternal Grandmother     breast   . Mental illness Maternal Grandmother   . Diabetes Paternal Grandfather     History  Substance Use Topics  . Smoking status: Passive Smoker  . Smokeless tobacco: Never Used  . Alcohol Use: No    Allergies: No Known Allergies  Prescriptions prior to admission  Medication Sig Dispense Refill  . cephALEXin (KEFLEX) 500 MG capsule Take 1 capsule (500 mg total) by mouth 3 (three) times daily.  21 capsule  0  . HYDROcodone-acetaminophen (NORCO) 5-325 MG per tablet Take 1-2 tablets by mouth every 4 (four) hours as needed.  30 tablet  0  . Prenatal Vit-Fe Fumarate-FA (PRENATAL MULTIVITAMIN)  TABS Take 1 tablet by mouth at bedtime.          Physical Exam   Blood pressure 101/67, temperature 98.4 F (36.9 C), temperature source Oral, resp. rate 16, last menstrual period 03/19/2011.  Appears well and in NAD. PE WNL No CVAT or abdominal pain.  FHR 150s by doppler  Results for orders placed during the hospital encounter of 12/23/11 (from the past 24 hour(s))  CBC     Status: Abnormal   Collection Time   12/23/11  5:27 PM      Component Value Range   WBC 14.5 (*) 4.0 - 10.5 (K/uL)   RBC 4.00  3.87 - 5.11 (MIL/uL)   Hemoglobin 11.3 (*) 12.0 - 15.0 (g/dL)   HCT 16.1 (*) 09.6 - 46.0 (%)   MCV 84.3  78.0 - 100.0 (fL)   MCH 28.3  26.0 - 34.0 (pg)   MCHC 33.5  30.0 - 36.0 (g/dL)   RDW 04.5  40.9 - 81.1 (%)   Platelets 297  150 - 400 (K/uL)  DIFFERENTIAL     Status: Abnormal   Collection Time   12/23/11  5:27 PM      Component Value Range   Neutrophils Relative 81 (*) 43 - 77 (%)   Neutro Abs 11.7 (*) 1.7 - 7.7 (K/uL)  Lymphocytes Relative 13  12 - 46 (%)   Lymphs Abs 1.9  0.7 - 4.0 (K/uL)   Monocytes Relative 6  3 - 12 (%)   Monocytes Absolute 0.8  0.1 - 1.0 (K/uL)   Eosinophils Relative 1  0 - 5 (%)   Eosinophils Absolute 0.1  0.0 - 0.7 (K/uL)   Basophils Relative 0  0 - 1 (%)   Basophils Absolute 0.0  0.0 - 0.1 (K/uL)     ED Course  IUP at 33 4/7 weeks Recent diagnosis of pyelonephritis--outpatient treatment, stable  Plan: D/C home. Continue Keflex 500 mg po TID x 7 days Keep scheduled appointment at [facility] 12/28/11. Call with any worsening of symptoms.  Nigel Bridgeman, CNM, [place] 12/23/11 6:20pm

## 2011-12-24 LAB — URINE CULTURE: Colony Count: >=100000

## 2011-12-24 LAB — CULTURE, BETA STREP (GROUP B ONLY)

## 2011-12-28 ENCOUNTER — Ambulatory Visit (INDEPENDENT_AMBULATORY_CARE_PROVIDER_SITE_OTHER): Payer: Medicaid Other | Admitting: Obstetrics and Gynecology

## 2011-12-28 ENCOUNTER — Encounter: Payer: Self-pay | Admitting: Obstetrics and Gynecology

## 2011-12-28 VITALS — BP 98/62 | Wt 200.0 lb

## 2011-12-28 DIAGNOSIS — O2343 Unspecified infection of urinary tract in pregnancy, third trimester: Secondary | ICD-10-CM

## 2011-12-28 DIAGNOSIS — N390 Urinary tract infection, site not specified: Secondary | ICD-10-CM

## 2011-12-28 DIAGNOSIS — O2390 Unspecified genitourinary tract infection in pregnancy, unspecified trimester: Secondary | ICD-10-CM

## 2011-12-28 MED ORDER — NITROFURANTOIN MACROCRYSTAL 50 MG PO CAPS: 50.0000 mg | ORAL_CAPSULE | Freq: Every day | ORAL | Status: AC

## 2011-12-28 NOTE — Patient Instructions (Signed)
Complete cephalexin today.  Tomorrow, begin Macrodantin 50 mg daily to prevent sfuture UTI's

## 2011-12-28 NOTE — Progress Notes (Signed)
States that she was [facility] and dx with kidney infection. Not having anymore sx.  Rx'd for culture positive UTI.  Needs Urine C&S TOC at [facility]. Begin Macrodantin prophylaxis tomorrow  GBS, GC,CHL [place].

## 2012-01-12 ENCOUNTER — Other Ambulatory Visit: Payer: Medicaid Other

## 2012-01-12 ENCOUNTER — Ambulatory Visit (INDEPENDENT_AMBULATORY_CARE_PROVIDER_SITE_OTHER): Payer: Medicaid Other

## 2012-01-12 VITALS — BP 100/62 | Wt 204.0 lb

## 2012-01-12 DIAGNOSIS — Z87440 Personal history of urinary (tract) infections: Secondary | ICD-10-CM

## 2012-01-12 DIAGNOSIS — Z3400 Encounter for supervision of normal first pregnancy, unspecified trimester: Secondary | ICD-10-CM

## 2012-01-12 NOTE — Progress Notes (Signed)
[redacted]w[redacted]d; Well.  GBS & cx's today.  Baby shower this Wynelle Link. Cx: ext os 1/ int os closed/long/-3; softening.   Stopped Macrodantin b/c of "swelling" of genitalia, and improved after d/c.  No other rxn symptoms. Rev'd [facility] and labor s/s.  Watch FH.

## 2012-01-12 NOTE — Progress Notes (Signed)
Urine C&S sent today   Pt states Macrodantin caused vaginal swelling

## 2012-01-13 LAB — GC/CHLAMYDIA PROBE AMP, GENITAL: GC Probe Amp, Genital: NEGATIVE

## 2012-01-14 LAB — STREP B DNA PROBE: GBSP: NEGATIVE

## 2012-01-16 LAB — URINE CULTURE: Colony Count: 65000

## 2012-01-17 ENCOUNTER — Other Ambulatory Visit: Payer: Medicaid Other

## 2012-01-19 ENCOUNTER — Encounter: Payer: Self-pay | Admitting: Obstetrics and Gynecology

## 2012-01-19 ENCOUNTER — Inpatient Hospital Stay (HOSPITAL_COMMUNITY)
Admission: AD | Admit: 2012-01-19 | Discharge: 2012-01-19 | Disposition: A | Discharge status: Home / Self Care | Payer: Medicaid Other | Source: Ambulatory Visit | Attending: Obstetrics and Gynecology | Admitting: Obstetrics and Gynecology

## 2012-01-19 ENCOUNTER — Ambulatory Visit (INDEPENDENT_AMBULATORY_CARE_PROVIDER_SITE_OTHER): Payer: Medicaid Other | Admitting: Obstetrics and Gynecology

## 2012-01-19 ENCOUNTER — Encounter (HOSPITAL_COMMUNITY): Payer: Self-pay | Admitting: *Deleted

## 2012-01-19 VITALS — BP 120/68 | Wt 206.0 lb

## 2012-01-19 DIAGNOSIS — O479 False labor, unspecified: Secondary | ICD-10-CM

## 2012-01-19 DIAGNOSIS — Z331 Pregnant state, incidental: Secondary | ICD-10-CM

## 2012-01-19 DIAGNOSIS — O368190 Decreased fetal movements, unspecified trimester, not applicable or unspecified: Secondary | ICD-10-CM

## 2012-01-19 DIAGNOSIS — F319 Bipolar disorder, unspecified: Secondary | ICD-10-CM

## 2012-01-19 DIAGNOSIS — Z3400 Encounter for supervision of normal first pregnancy, unspecified trimester: Secondary | ICD-10-CM

## 2012-01-19 DIAGNOSIS — O212 Late vomiting of pregnancy: Secondary | ICD-10-CM | POA: Insufficient documentation

## 2012-01-19 HISTORY — DX: Infections of kidney in pregnancy, unspecified trimester: O23.00

## 2012-01-19 LAB — URINALYSIS, ROUTINE W REFLEX MICROSCOPIC
Glucose, UA: NEGATIVE mg/dL
Ketones, ur: NEGATIVE mg/dL
Protein, ur: NEGATIVE mg/dL
Urobilinogen, UA: 0.2 mg/dL (ref 0.0–1.0)

## 2012-01-19 MED ORDER — PROMETHAZINE HCL 25 MG/ML IJ SOLN
12.5000 mg | Freq: Once | INTRAMUSCULAR | Status: AC
  Administered 2012-01-19: 12.5 mg via INTRAVENOUS
  Filled 2012-01-19: qty 1

## 2012-01-19 MED ORDER — LACTATED RINGERS IV BOLUS (SEPSIS)
500.0000 mL | Freq: Once | INTRAVENOUS | Status: AC
  Administered 2012-01-19: 13:00:00 via INTRAVENOUS

## 2012-01-19 MED ORDER — NALBUPHINE SYRINGE 5 MG/0.5 ML
20.0000 mg | INJECTION | Freq: Once | INTRAMUSCULAR | Status: AC
  Administered 2012-01-19: 20 mg via INTRAVENOUS
  Filled 2012-01-19: qty 2

## 2012-01-19 MED ORDER — LACTATED RINGERS IV SOLN
INTRAVENOUS | Status: DC
  Administered 2012-01-19: 13:00:00 via INTRAVENOUS

## 2012-01-19 MED ORDER — NALBUPHINE HCL 10 MG/ML IJ SOLN
20.0000 mg | Freq: Once | INTRAMUSCULAR | Status: DC
  Filled 2012-01-19: qty 1

## 2012-01-19 NOTE — Progress Notes (Signed)
Pt states no LOF and no bleeding.  Unsure of mucus plug.  Severe back pain that sends shooting pain to pelvis and cervix.  Unable to get urine at this time.

## 2012-01-19 NOTE — MAU Provider Note (Signed)
History   21 yo G1P0 at 56 3/7 weeks presented from the office for further labor evaluation--cervix was 1 cm, 90%, vtx, 0 station, with BBOW by Dr. Cloretta Ned exam, and contractions q 3-5 min on NST (done due to decreased FM, but was reactive).  Denies leaking, has passed bloody mucus after exam.  Now perceives normal FM.  Pregnancy remarkable for: Patient Active Problem List  Diagnosis  . Normal pregnancy, first - tx of PNC at 20wks  . UTI (lower urinary tract infection)  . Bipolar 1 disorder - no meds for 2.5 years  . Pyelonephritis complicating pregnancy, antepartum, third trimester     Chief Complaint  Patient presents with  . Labor Eval   OB History    Grav Para Term Preterm Abortions TAB SAB Ect Mult Living   1 0 0 0 0 0 0 0 0 0       Past Medical History  Diagnosis Date  . H/O varicella   . Bipolar disorder   . Depression     bipolar, no meds    No past surgical history on file.  Family History  Problem Relation Age of Onset  . Anesthesia problems Neg Hx   . Mental illness Mother   . Drug abuse Father   . Cancer Maternal Grandmother     breast   . Mental illness Maternal Grandmother   . Diabetes Paternal Grandfather     History  Substance Use Topics  . Smoking status: Passive Smoker  . Smokeless tobacco: Never Used  . Alcohol Use: No    Allergies: No Known Allergies  Prescriptions prior to admission  Medication Sig Dispense Refill  . Prenatal Vit-Fe Fumarate-FA (PRENATAL MULTIVITAMIN) TABS Take 1 tablet by mouth at bedtime.          Physical Exam   Blood pressure 116/79, pulse 78, temperature 97.8 F (36.6 C), resp. rate 22, height 5' 3.5" (1.613 m), weight 206 lb 9.6 oz (93.713 kg), last menstrual period 03/19/2011, SpO2 100.00%.  Chest clear Heart RRR without murmur Abd gravid, NT Pelvic--deferred at present, due to recent exam at [facility] . Ext WNL  FHR reactive, no decels UCs q 7 min in initial 20 min tracing.  ED Course  IUP at 37 3/7  weeks Early vs prodromal labor Reactive FHR  Plan: Observe and re-evaluate cervix. Comfort measures prn.  Nigel Bridgeman, CNM, [place] 01/19/12 10:55a   Addendum: Very uncomfortable, although UCs palpate as mild and irregular.  Reports back pain is primary issue--crying constantly. Also c/o nausea, but no vomiting. FHR reactive, no decels UCs q 2-6 min, some segments are more irritability vs organized contractions.  Cervix posterior, 1 cm, 75%, vtx, -1, membranes intact, but not bulging.  Will hydrate and give therapeutic sedation, then re-evaluate.  Nigel Bridgeman, CNM, [place] 01/19/12 11:55am  Addendum: Patient slept soundly for several hours after Nubain and Phenergan. Then awoke, crying out loudly. UCs irregular, mild. Unable to assess cervix due to patient discomfort and very distended bladder, causing presenting part to be displaced. Vtx to bedside US, but able to see significantly distended bladder. To BR, with large amount of urine passed.  Patient more comfortable after that.  Still very sleepy and unsteady on feet. Cervix 1 cm, 7%%, vtx, -1, membranes intact, small amount mucusy d/c.  Will allow to awaken more-anticipate sending patient home. Labor s/s reviewed in detail with patient.  Nigel Bridgeman, CNM, [place] 01/19/12 3:30pm  Addendum: Patient feeling much better, now awake and alert per RN  report. Ready for d/c. Labor precautions/instructions reviewed. Will keep scheduled appointment or call prn.  Nigel Bridgeman, CNM, [place] 01/19/12 4:45

## 2012-01-19 NOTE — Discharge Instructions (Signed)
Normal Labor and Delivery Your caregiver must first be sure you are in labor. Signs of labor include:  You may pass what is called "the mucus plug" before labor begins. This is a small amount of blood stained mucus.   Regular uterine contractions.   The time between contractions get closer together.   The discomfort and pain gradually gets more intense.   Pains are mostly located in the back.   Pains get worse when walking.   The cervix (the opening of the uterus becomes thinner (begins to efface) and opens up (dilates).  Once you are in labor and admitted into the hospital or care center, your caregiver will do the following:  A complete physical examination.   Check your vital signs (blood pressure, pulse, temperature and the fetal heart rate).   Do a vaginal examination (using a sterile glove and lubricant) to determine:   The position (presentation) of the baby (head [vertex] or buttock first).   The level (station) of the baby's head in the birth canal.   The effacement and dilatation of the cervix.   You may have your pubic hair shaved and be given an enema depending on your caregiver and the circumstance.   An electronic monitor is usually placed on your abdomen. The monitor follows the length and intensity of the contractions, as well as the baby's heart rate.   Usually, your caregiver will insert an IV in your arm with a bottle of sugar water. This is done as a precaution so that medications can be given to you quickly during labor or delivery.  NORMAL LABOR AND DELIVERY IS DIVIDED UP INTO 3 STAGES: First Stage This is when regular contractions begin and the cervix begins to efface and dilate. This stage can last from 3 to 15 hours. The end of the first stage is when the cervix is 100% effaced and 10 centimeters dilated. Pain medications may be given by   Injection (morphine, demerol, etc.)   Regional anesthesia (spinal, caudal or epidural, anesthetics given in  different locations of the spine). Paracervical pain medication may be given, which is an injection of and anesthetic on each side of the cervix.  A pregnant woman may request to have "Natural Childbirth" which is not to have any medications or anesthesia during her labor and delivery. Second Stage This is when the baby comes down through the birth canal (vagina) and is born. This can take 1 to 4 hours. As the baby's head comes down through the birth canal, you may feel like you are going to have a bowel movement. You will get the urge to bear down and push until the baby is delivered. As the baby's head is being delivered, the caregiver will decide if an episiotomy (a cut in the perineum and vagina area) is needed to prevent tearing of the tissue in this area. The episiotomy is sewn up after the delivery of the baby and placenta. Sometimes a mask with nitrous oxide is given for the mother to breath during the delivery of the baby to help if there is too much pain. The end of Stage 2 is when the baby is fully delivered. Then when the umbilical cord stops pulsating it is clamped and cut. Third Stage The third stage begins after the baby is completely delivered and ends after the placenta (afterbirth) is delivered. This usually takes 5 to 30 minutes. After the placenta is delivered, a medication is given either by intravenous or injection to help contract   the uterus and prevent bleeding. The third stage is not painful and pain medication is usually not necessary. If an episiotomy was done, it is repaired at this time. After the delivery, the mother is watched and monitored closely for 1 to 2 hours to make sure there is no postpartum bleeding (hemorrhage). If there is a lot of bleeding, medication is given to contract the uterus and stop the bleeding. Document Released: 04/18/2008 Document Revised: 06/29/2011 Document Reviewed: 04/18/2008 [employer] Patient Information 2012 [employer]. 

## 2012-01-19 NOTE — Progress Notes (Signed)
NST: reactive with contractions every 5 minutes. To MAU for monitoring and possible early labor.

## 2012-01-19 NOTE — MAU Note (Signed)
Patient was seen in the office this am for a regular appointment, was 1 cm and sent to MAU for evaluation. States contractions every 6 minutes with fetal movement.

## 2012-01-19 NOTE — MAU Note (Signed)
Ctx's started around 3a.m.  Getting stronger, mucous d/c.   Scant bleeding noted after void.

## 2012-01-19 NOTE — MAU Note (Signed)
CNM unable to reach cervix, bedside sono, full bladder noted, pt complaining of lower abd pressure.  Monitor off-up to bathroom.  Returned to bed, confirmed vertex with bedside sono; rechecked cervix-tolerated exam and [place].  Quiet and calm now.

## 2012-01-19 NOTE — Progress Notes (Signed)
C/o pelvic pressure and right sided back pain since 3 a.m. This morning. Also states that over the past week she has had sharp pain pushing at her cervix. No bleeding or d/c. Also states that she felt baby move 2 times yesterday. Has not felt movement today. Pt unable to leave urine sample.

## 2012-01-22 ENCOUNTER — Telehealth (HOSPITAL_COMMUNITY): Payer: Self-pay | Admitting: *Deleted

## 2012-01-22 NOTE — Telephone Encounter (Signed)
Preadmission screen  

## 2012-01-23 ENCOUNTER — Encounter (HOSPITAL_COMMUNITY): Payer: Self-pay | Admitting: *Deleted

## 2012-01-23 ENCOUNTER — Inpatient Hospital Stay (HOSPITAL_COMMUNITY)
Admission: AD | Admit: 2012-01-23 | Discharge: 2012-01-23 | Disposition: A | Discharge status: Home / Self Care | Payer: Medicaid Other | Source: Ambulatory Visit | Attending: Obstetrics and Gynecology | Admitting: Obstetrics and Gynecology

## 2012-01-23 ENCOUNTER — Telehealth: Payer: Self-pay | Admitting: Obstetrics and Gynecology

## 2012-01-23 DIAGNOSIS — O479 False labor, unspecified: Secondary | ICD-10-CM | POA: Insufficient documentation

## 2012-01-23 DIAGNOSIS — O471 False labor at or after 37 completed weeks of gestation: Secondary | ICD-10-CM

## 2012-01-23 LAB — AMNISURE RUPTURE OF MEMBRANE (ROM) NOT AT ARMC: Amnisure ROM: NEGATIVE

## 2012-01-23 MED ORDER — OXYCODONE-ACETAMINOPHEN 5-325 MG PO TABS
1.0000 | ORAL_TABLET | Freq: Once | ORAL | Status: AC
  Administered 2012-01-23: 1 via ORAL
  Filled 2012-01-23: qty 1

## 2012-01-23 MED ORDER — ZOLPIDEM TARTRATE 5 MG PO TABS
5.0000 mg | ORAL_TABLET | Freq: Once | ORAL | Status: AC
  Administered 2012-01-23: 5 mg via ORAL
  Filled 2012-01-23: qty 1

## 2012-01-23 NOTE — Telephone Encounter (Signed)
Spoke with pt rgd concerns pt c/o consistent contractions this morning with spotting and bleeding no fetal movement yet today pt on the way to hospital advised pt will inform midwife on call pt voice understanding

## 2012-01-23 NOTE — MAU Note (Signed)
Pt states, " I've had contractions since 9:45, and it is consistent pain and I have bloody discharge."

## 2012-01-23 NOTE — Telephone Encounter (Signed)
niccole talking to pt.

## 2012-01-31 ENCOUNTER — Encounter: Payer: Self-pay | Admitting: Obstetrics and Gynecology

## 2012-01-31 ENCOUNTER — Ambulatory Visit (INDEPENDENT_AMBULATORY_CARE_PROVIDER_SITE_OTHER): Payer: Medicaid Other | Admitting: Obstetrics and Gynecology

## 2012-01-31 VITALS — BP 110/78 | Wt 209.0 lb

## 2012-01-31 DIAGNOSIS — Z331 Pregnant state, incidental: Secondary | ICD-10-CM

## 2012-01-31 NOTE — Progress Notes (Signed)
[redacted]w[redacted]d 2/80/-2 mem swept Discussed at length labor sx's and FKC Plans epidural Has pattern of ctx, mostly in back rvd' labor positions Considering mirena or nexplanon  rv'd FKC rv'd PD mgmnt  RTO 1wk

## 2012-01-31 NOTE — Progress Notes (Signed)
Pt reqs cx today  Recent MAU visit for ctx 6/28

## 2012-01-31 NOTE — Patient Instructions (Addendum)

## 2012-02-03 ENCOUNTER — Encounter (HOSPITAL_COMMUNITY): Payer: Self-pay | Admitting: *Deleted

## 2012-02-03 ENCOUNTER — Inpatient Hospital Stay (HOSPITAL_COMMUNITY)
Admission: AD | Admit: 2012-02-03 | Discharge: 2012-02-04 | DRG: 775 | Disposition: A | Discharge status: Home / Self Care | Payer: Medicaid Other | Source: Ambulatory Visit | Attending: Obstetrics and Gynecology | Admitting: Obstetrics and Gynecology

## 2012-02-03 ENCOUNTER — Inpatient Hospital Stay (HOSPITAL_COMMUNITY): Payer: Medicaid Other | Admitting: Anesthesiology

## 2012-02-03 ENCOUNTER — Encounter (HOSPITAL_COMMUNITY): Payer: Self-pay | Admitting: Anesthesiology

## 2012-02-03 DIAGNOSIS — F411 Generalized anxiety disorder: Secondary | ICD-10-CM | POA: Diagnosis present

## 2012-02-03 DIAGNOSIS — Z3400 Encounter for supervision of normal first pregnancy, unspecified trimester: Secondary | ICD-10-CM

## 2012-02-03 DIAGNOSIS — F319 Bipolar disorder, unspecified: Secondary | ICD-10-CM

## 2012-02-03 DIAGNOSIS — O99344 Other mental disorders complicating childbirth: Secondary | ICD-10-CM | POA: Diagnosis present

## 2012-02-03 DIAGNOSIS — F329 Major depressive disorder, single episode, unspecified: Secondary | ICD-10-CM

## 2012-02-03 DIAGNOSIS — IMO0002 Reserved for concepts with insufficient information to code with codable children: Secondary | ICD-10-CM

## 2012-02-03 DIAGNOSIS — Z3490 Encounter for supervision of normal pregnancy, unspecified, unspecified trimester: Secondary | ICD-10-CM

## 2012-02-03 DIAGNOSIS — Z915 Personal history of self-harm: Secondary | ICD-10-CM

## 2012-02-03 DIAGNOSIS — O2300 Infections of kidney in pregnancy, unspecified trimester: Secondary | ICD-10-CM | POA: Diagnosis present

## 2012-02-03 LAB — CBC
Hemoglobin: 12 g/dL (ref 12.0–15.0)
MCHC: 33.8 g/dL (ref 30.0–36.0)
Platelets: 300 10*3/uL (ref 150–400)
RDW: 13.6 % (ref 11.5–15.5)

## 2012-02-03 LAB — RPR: RPR Ser Ql: NONREACTIVE

## 2012-02-03 MED ORDER — PHENYLEPHRINE 40 MCG/ML (10ML) SYRINGE FOR IV PUSH (FOR BLOOD PRESSURE SUPPORT)
80.0000 ug | PREFILLED_SYRINGE | INTRAVENOUS | Status: DC | PRN
  Filled 2012-02-03: qty 5

## 2012-02-03 MED ORDER — ONDANSETRON HCL 4 MG/2ML IJ SOLN: 4.0000 mg | INTRAMUSCULAR | Status: DC | PRN

## 2012-02-03 MED ORDER — IBUPROFEN 600 MG PO TABS: 600.0000 mg | ORAL_TABLET | Freq: Four times a day (QID) | ORAL | Status: DC | PRN

## 2012-02-03 MED ORDER — ONDANSETRON HCL 4 MG/2ML IJ SOLN: 4.0000 mg | Freq: Four times a day (QID) | INTRAMUSCULAR | Status: DC | PRN

## 2012-02-03 MED ORDER — DIPHENHYDRAMINE HCL 25 MG PO CAPS: 25.0000 mg | ORAL_CAPSULE | Freq: Four times a day (QID) | ORAL | Status: DC | PRN

## 2012-02-03 MED ORDER — PRENATAL MULTIVITAMIN CH
1.0000 | ORAL_TABLET | Freq: Every day | ORAL | Status: DC
  Administered 2012-02-04: 1 via ORAL
  Filled 2012-02-03: qty 1

## 2012-02-03 MED ORDER — OXYCODONE-ACETAMINOPHEN 5-325 MG PO TABS: 1.0000 | ORAL_TABLET | ORAL | Status: DC | PRN

## 2012-02-03 MED ORDER — PHENYLEPHRINE 40 MCG/ML (10ML) SYRINGE FOR IV PUSH (FOR BLOOD PRESSURE SUPPORT): 80.0000 ug | PREFILLED_SYRINGE | INTRAVENOUS | Status: DC | PRN

## 2012-02-03 MED ORDER — CITRIC ACID-SODIUM CITRATE 334-500 MG/5ML PO SOLN: 30.0000 mL | ORAL | Status: DC | PRN

## 2012-02-03 MED ORDER — EPHEDRINE 5 MG/ML INJ
10.0000 mg | INTRAVENOUS | Status: DC | PRN
  Filled 2012-02-03: qty 4

## 2012-02-03 MED ORDER — DIPHENHYDRAMINE HCL 50 MG/ML IJ SOLN: 12.5000 mg | INTRAMUSCULAR | Status: DC | PRN

## 2012-02-03 MED ORDER — BENZOCAINE-MENTHOL 20-0.5 % EX AERO
1.0000 "application " | INHALATION_SPRAY | CUTANEOUS | Status: DC | PRN
  Filled 2012-02-03: qty 56

## 2012-02-03 MED ORDER — OXYTOCIN 10 UNIT/ML IJ SOLN: 10.0000 [IU] | Freq: Once | INTRAMUSCULAR | Status: DC

## 2012-02-03 MED ORDER — LACTATED RINGERS IV SOLN: 500.0000 mL | Freq: Once | INTRAVENOUS | Status: DC

## 2012-02-03 MED ORDER — METHYLERGONOVINE MALEATE 0.2 MG/ML IJ SOLN: 0.2000 mg | INTRAMUSCULAR | Status: DC | PRN

## 2012-02-03 MED ORDER — EPHEDRINE 5 MG/ML INJ: 10.0000 mg | INTRAVENOUS | Status: DC | PRN

## 2012-02-03 MED ORDER — ACETAMINOPHEN 325 MG PO TABS: 650.0000 mg | ORAL_TABLET | ORAL | Status: DC | PRN

## 2012-02-03 MED ORDER — SENNOSIDES-DOCUSATE SODIUM 8.6-50 MG PO TABS
2.0000 | ORAL_TABLET | Freq: Every day | ORAL | Status: DC
  Administered 2012-02-03: 2 via ORAL

## 2012-02-03 MED ORDER — LANOLIN HYDROUS EX OINT: TOPICAL_OINTMENT | CUTANEOUS | Status: DC | PRN

## 2012-02-03 MED ORDER — LIDOCAINE HCL (PF) 1 % IJ SOLN
INTRAMUSCULAR | Status: DC | PRN
  Administered 2012-02-03 (×2): 5 mL

## 2012-02-03 MED ORDER — DIBUCAINE 1 % RE OINT: 1.0000 "application " | TOPICAL_OINTMENT | RECTAL | Status: DC | PRN

## 2012-02-03 MED ORDER — OXYTOCIN BOLUS FROM INFUSION
250.0000 mL | Freq: Once | INTRAVENOUS | Status: DC
  Filled 2012-02-03: qty 500

## 2012-02-03 MED ORDER — LACTATED RINGERS IV SOLN: 500.0000 mL | INTRAVENOUS | Status: DC | PRN

## 2012-02-03 MED ORDER — LIDOCAINE HCL (PF) 1 % IJ SOLN
30.0000 mL | INTRAMUSCULAR | Status: DC | PRN
  Filled 2012-02-03: qty 30

## 2012-02-03 MED ORDER — LACTATED RINGERS IV SOLN
INTRAVENOUS | Status: DC
  Administered 2012-02-03: 03:00:00 via INTRAVENOUS

## 2012-02-03 MED ORDER — WITCH HAZEL-GLYCERIN EX PADS: 1.0000 "application " | MEDICATED_PAD | CUTANEOUS | Status: DC | PRN

## 2012-02-03 MED ORDER — TETANUS-DIPHTH-ACELL PERTUSSIS 5-2.5-18.5 LF-MCG/0.5 IM SUSP
0.5000 mL | Freq: Once | INTRAMUSCULAR | Status: AC
  Administered 2012-02-04: 0.5 mL via INTRAMUSCULAR

## 2012-02-03 MED ORDER — FENTANYL 2.5 MCG/ML BUPIVACAINE 1/10 % EPIDURAL INFUSION (WH - ANES)
14.0000 mL/h | INTRAMUSCULAR | Status: DC
  Administered 2012-02-03 (×2): 14 mL/h via EPIDURAL
  Filled 2012-02-03 (×2): qty 60

## 2012-02-03 MED ORDER — METHYLERGONOVINE MALEATE 0.2 MG PO TABS: 0.2000 mg | ORAL_TABLET | ORAL | Status: DC | PRN

## 2012-02-03 MED ORDER — ONDANSETRON HCL 4 MG PO TABS: 4.0000 mg | ORAL_TABLET | ORAL | Status: DC | PRN

## 2012-02-03 MED ORDER — OXYTOCIN 40 UNITS IN LACTATED RINGERS INFUSION - SIMPLE MED
62.5000 mL/h | Freq: Once | INTRAVENOUS | Status: AC
  Administered 2012-02-03: 62.5 mL/h via INTRAVENOUS
  Filled 2012-02-03: qty 1000

## 2012-02-03 MED ORDER — ZOLPIDEM TARTRATE 5 MG PO TABS: 5.0000 mg | ORAL_TABLET | Freq: Every evening | ORAL | Status: DC | PRN

## 2012-02-03 MED ORDER — SIMETHICONE 80 MG PO CHEW: 80.0000 mg | CHEWABLE_TABLET | ORAL | Status: DC | PRN

## 2012-02-03 MED ORDER — IBUPROFEN 600 MG PO TABS
600.0000 mg | ORAL_TABLET | Freq: Four times a day (QID) | ORAL | Status: DC
  Administered 2012-02-03 – 2012-02-04 (×4): 600 mg via ORAL
  Filled 2012-02-03 (×4): qty 1

## 2012-02-03 NOTE — Progress Notes (Signed)
Tonya Ellis is a 21 y.Ellis. G1P0000 at [redacted]w[redacted]d admitted for active labor  Subjective: Pt c/Ellis increased rectal/vaginal pressure with UCs.  No severe pain with epidural.   Objective: BP 111/54  Pulse 64  Temp 98.6 F (37 C) (Oral)  Resp 16  Ht 5\' 4"  (1.626 m)  Wt 95.255 kg (210 lb)  BMI 36.05 kg/m2  SpO2 100%  LMP 03/19/2011     FHT:  FHR: 125 bpm, variability: moderate,  accelerations:  Present,  decelerations:  Absent UC:   regular, every 3-4 minutes by pt behavior, toco poorly tracing UCs.  Mild to mod to palpation. SVE:   Dilation: 5 Effacement (%): 90 Station: -1 Exam by:: Tonya Ellis, CNM  Labs: Lab Results  Component Value Date   WBC 14.8* 02/03/2012   HGB 12.0 02/03/2012   HCT 35.5* 02/03/2012   MCV 82.4 02/03/2012   PLT 300 02/03/2012    Assessment / Plan: IUP at 39w 4d Spontaneous labor  Labor: Active labor with no recent change Preeclampsia:  no signs or symptoms of toxicity Fetal Wellbeing:  Category I Pain Control:  Epidural I/D:  n/a Anticipated MOD:  NSVD  Will bolus epidural to achieve adequate anesthesia. Reeval SVE in 1-2hrs and if no to minimal change will proceed with IUPC and pitocin.  Tonya Ellis. 02/03/2012, 7:53 AM

## 2012-02-03 NOTE — Progress Notes (Signed)
Spoke with Larita Fife, SW.  Discussed POC & need for psych follow-up and/or referral post-partum

## 2012-02-03 NOTE — Anesthesia Preprocedure Evaluation (Signed)
Anesthesia Evaluation  Patient identified by MRN, date of birth, ID band Patient awake    Reviewed: Allergy & Precautions, H&P , Patient's Chart, lab work & pertinent test results  Airway Mallampati: III TM Distance: >3 FB Neck ROM: full    Dental No notable dental hx.    Pulmonary neg pulmonary ROS,  breath sounds clear to auscultation  Pulmonary exam normal       Cardiovascular negative cardio ROS  Rhythm:regular Rate:Normal     Neuro/Psych negative neurological ROS  negative psych ROS   GI/Hepatic negative GI ROS, Neg liver ROS,   Endo/Other  negative endocrine ROS  Renal/GU negative Renal ROS     Musculoskeletal   Abdominal   Peds  Hematology negative hematology ROS (+)   Anesthesia Other Findings H/O varicella     Bipolar disorder        Pyelonephritis during pregnancy, antepartum 2013   Depression   bipolar, no meds- doing well    Reproductive/Obstetrics (+) Pregnancy                           Anesthesia Physical Anesthesia Plan  ASA: II  Anesthesia Plan: Epidural   Post-op Pain Management:    Induction:   Airway Management Planned:   Additional Equipment:   Intra-op Plan:   Post-operative Plan:   Informed Consent: I have reviewed the patients History and Physical, chart, labs and discussed the procedure including the risks, benefits and alternatives for the proposed anesthesia with the patient or authorized representative who has indicated his/her understanding and acceptance.     Plan Discussed with:   Anesthesia Plan Comments:         Anesthesia Quick Evaluation

## 2012-02-03 NOTE — Progress Notes (Signed)
Patient ID: Tonya Ellis, female   DOB: 1991/07/02, 21 y.o.   MRN: [id number] .Subjective:  Comfortable now after epidural  Objective: BP 100/52  Pulse 60  Temp 97.5 F (36.4 C) (Oral)  Resp 18  Ht 5\' 4"  (1.626 m)  Wt 210 lb (95.255 kg)  BMI 36.05 kg/m2  SpO2 100%  LMP 03/19/2011   FHT:  FHR: 110 bpm, variability: moderate,  accelerations:  Present,  decelerations:  Absent UC:   Not tracing well at this time SVE:   Dilation: 4.5 Effacement (%): 100 Station: -1 Exam by:: S.Renzo Vincelette,CNM  AROM w clear fluid  Assessment / Plan: Spontaneous labor, progressing normally GBS neg Planning adoption   Fetal Wellbeing:  Category I Pain Control:  Epidural  Update physician PRN  [name] 02/03/2012, 6:28 AM

## 2012-02-03 NOTE — MAU Note (Signed)
Pt states, " I've had contractions since 8pm and they worse in past 2 hrs. With bloody show."

## 2012-02-03 NOTE — Progress Notes (Signed)
Tonya Ellis is a 21 y.o. G1P0000 at [redacted]w[redacted]d by admitted for active labor  Subjective: Pt c/o increased pressure and sl urge to push with UCs.  Pt crying and moaning.    Objective: BP 100/75  Pulse 85  Temp 98.2 F (36.8 C) (Oral)  Resp 20  Ht 5\' 4"  (1.626 m)  Wt 95.255 kg (210 lb)  BMI 36.05 kg/m2  SpO2 100%  LMP 03/19/2011     FHT:  FHR: 135 bpm, variability: moderate,  accelerations:  Present,  decelerations:  Absent UC:   regular, every 2-3 minutes SVE:   Dilation: 10 Effacement (%): 100 Station: +1;+2 Exam by:: Conni Elliot, CNM  Labs: Lab Results  Component Value Date   WBC 14.8* 02/03/2012   HGB 12.0 02/03/2012   HCT 35.5* 02/03/2012   MCV 82.4 02/03/2012   PLT 300 02/03/2012    Assessment / Plan: Spontaneous labor, progressing normally IUP at 39w 4d  Labor: Progressing normally Preeclampsia:  no signs or symptoms of toxicity Fetal Wellbeing:  Category I Pain Control:  Epidural I/D:  n/a Anticipated MOD:  NSVD  Will begin pushing with UCs.    Tonya Ellis O. 02/03/2012, 10:32 AM

## 2012-02-03 NOTE — Progress Notes (Signed)
Baby to be given for private/open adoption.  Pt advised on breast care for bottle feeding, but decided to breastfeed "just this one time."
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
## 2012-02-03 NOTE — Anesthesia Procedure Notes (Signed)
Epidural Patient location during procedure: OB Start time: 02/03/2012 3:32 AM  Staffing Anesthesiologist: Brayton Caves R Performed by: anesthesiologist   Preanesthetic Checklist Completed: patient identified, site marked, surgical consent, pre-op evaluation, timeout performed, IV checked, risks and benefits discussed and monitors and equipment checked  Epidural Patient position: sitting Prep: site prepped and draped and DuraPrep Patient monitoring: continuous pulse ox and blood pressure Approach: midline Injection technique: LOR air and LOR saline  Needle:  Needle type: Tuohy  Needle gauge: 17 G Needle length: 9 cm Needle insertion depth: 5 cm cm Catheter type: closed end flexible Catheter size: 19 Gauge Catheter at skin depth: 10 cm Test dose: negative  Assessment Events: blood not aspirated, injection not painful, no injection resistance, negative IV test and no paresthesia  Additional Notes Patient identified.  Risk benefits discussed including failed block, incomplete pain control, headache, nerve damage, paralysis, blood pressure changes, nausea, vomiting, reactions to medication both toxic or allergic, and postpartum back pain.  Patient expressed understanding and wished to proceed.  All questions were answered.  Sterile technique used throughout procedure and epidural site dressed with sterile barrier dressing. No paresthesia or other complications noted.The patient did not experience any signs of intravascular injection such as tinnitus or metallic taste in mouth nor signs of intrathecal spread such as rapid motor block. Please see nursing notes for vital signs.

## 2012-02-03 NOTE — Anesthesia Postprocedure Evaluation (Signed)
  Anesthesia Post-op Note  Patient: Tonya Ellis  Procedure(s) Performed: * No procedures listed *  Patient Location: PACU and Mother/Baby  Anesthesia Type: Epidural  Level of Consciousness: awake, alert  and oriented  Airway and Oxygen Therapy: Patient Spontanous Breathing    Post-op Assessment: Patient's Cardiovascular Status Stable and Respiratory Function Stable  Post-op Vital Signs: stable  Complications: No apparent anesthesia complications
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
## 2012-02-03 NOTE — Progress Notes (Signed)
Notified security that pt's mother & sister are not allowed in her room.

## 2012-02-04 LAB — CBC
MCHC: 34.3 g/dL (ref 30.0–36.0)
MCV: 84.2 fL (ref 78.0–100.0)
Platelets: 290 10*3/uL (ref 150–400)
RDW: 14 % (ref 11.5–15.5)
WBC: 14.9 10*3/uL — ABNORMAL HIGH (ref 4.0–10.5)

## 2012-02-04 MED ORDER — IBUPROFEN 600 MG PO TABS: 600.0000 mg | ORAL_TABLET | Freq: Four times a day (QID) | ORAL | Status: AC | PRN
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
## 2012-02-05 ENCOUNTER — Encounter: Payer: Medicaid Other | Admitting: Obstetrics and Gynecology

## 2012-02-06 NOTE — Progress Notes (Signed)
Post discharge UR review completed. 

## 2012-02-15 ENCOUNTER — Encounter: Payer: Self-pay | Admitting: Obstetrics and Gynecology

## 2012-02-15 ENCOUNTER — Ambulatory Visit (INDEPENDENT_AMBULATORY_CARE_PROVIDER_SITE_OTHER): Payer: Medicaid Other | Admitting: Obstetrics and Gynecology

## 2012-02-15 VITALS — BP 120/88 | Resp 16 | Wt 194.0 lb

## 2012-02-15 DIAGNOSIS — Z202 Contact with and (suspected) exposure to infections with a predominantly sexual mode of transmission: Secondary | ICD-10-CM | POA: Insufficient documentation

## 2012-02-15 DIAGNOSIS — Z2089 Contact with and (suspected) exposure to other communicable diseases: Secondary | ICD-10-CM

## 2012-02-15 LAB — POCT WET PREP (WET MOUNT)
Trichomonas Wet Prep HPF POC: NEGATIVE
WBC, Wet Prep HPF POC: NEGATIVE

## 2012-02-15 LAB — POCT OSOM BVBLUE TEST
Bacterial Vaginosis: NEGATIVE
pH: 5
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
## 2012-02-16 LAB — GC/CHLAMYDIA PROBE AMP, GENITAL: Chlamydia, DNA Probe: NEGATIVE

## 2012-02-19 ENCOUNTER — Telehealth: Payer: Self-pay

## 2012-02-19 NOTE — Telephone Encounter (Signed)
Message copied by Janeece Agee on Mon Feb 19, 2012  9:40 AM ------      Message from: Earl Gala      Created: Fri Feb 16, 2012  4:41 PM       Patient to to have social work consult and f/u for result of HSV2

## 2012-02-19 NOTE — Telephone Encounter (Signed)
Spoke with pt informing her HSV 2 is positive. Some people have breakouts and some may not. If she needs a rx to call and let us know. Pt agrees and voices understanding.

## 2012-02-20 ENCOUNTER — Other Ambulatory Visit: Payer: Self-pay | Admitting: Obstetrics and Gynecology

## 2012-02-20 MED ORDER — VALACYCLOVIR HCL 500 MG PO TABS: ORAL_TABLET | ORAL | Status: DC

## 2012-02-20 NOTE — Telephone Encounter (Signed)
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 Rx for valtrex sent to [facility] on [address] per pt request. Pt voiced understanding.

## 2012-02-20 NOTE — Telephone Encounter (Signed)
TRIAGE/EPIC °

## 2012-03-13 ENCOUNTER — Ambulatory Visit (INDEPENDENT_AMBULATORY_CARE_PROVIDER_SITE_OTHER): Payer: Medicaid Other

## 2012-03-13 ENCOUNTER — Encounter: Payer: Self-pay | Admitting: Obstetrics and Gynecology

## 2012-03-13 MED ORDER — VALACYCLOVIR HCL 500 MG PO TABS: ORAL_TABLET | ORAL | Status: DC

## 2012-03-13 NOTE — Progress Notes (Signed)
Tonya Ellis  is 5.5 weeks postpartum following a spontaneous vaginal delivery at 18 gestational weeks Date: 02/03/2012 female baby named "Kara Mead sophia"  delivered by Elsie [facility] cnm  Pt stated that she adopted out baby to her friends mother it's a private and open adoption  per pt .  Has seen infant 3-4 times since delivery.  Gave infant the adopted family's last name and papers completed w/ a lawyer one day after d/c from hospital.   Has barely communicated w/ her mom since delivery; mother didn't come to hospital for birth, and pt has since moved out of her house.  Pt has 14y.o. Sister still living at home.  Pt struggling w/ mother's promise to help w/ newborn, but then didn't and she wound up giving baby up for adoption.   Denies IC since delivery.   No lochia for about 1.5 weeks, but did have stop and restart episodes the last week or so before completely finished. Pt did BF while in hospital and used cabbage leaves for engorgement after d/c.   Previous contraception:  Depo, but disliked b/c caused galactorrhea when she was a teen  Breastfeeding: no Bottlefeeding:  no  Post-partum blues / depression:  no  EPDS score: 4 History of abnormal Pap:  no  Last Pap: Date  Pt stated when she was 21 years old  Gestational diabetes:  no  Contraception:  Desires nexpalon   Normal urinary function:  yes Normal GI function:  yes Returning to work:  Pt is not working at this time; not in school, but is looking for a job; living w/ current s.o; he owns his own business and present at today's visit (curly mustache).   O:  .Marland Kitchen Filed Vitals:   03/13/12 0952  BP: 104/58  Weight: 205 lb 9.6 oz (93.26 kg)  PE:  Gen:  NAD, A&Ox3, glasses, facial acne         Abd:  Soft, striae remain bright pink; no diastasis; slightly tender LLQ; no organomegaly         Uterus:  Normal involution Pelvic:  Genitalia WNL w/o lesions; uncomfortable w/ bimanual exam; d/c white at introitus; hemostatic labial lac well-healed.   Cx: closed/long; no CMT. Ext:  WNL  A:  5.5 weeks PP      Poor social circumstances      Infant given up for adoption      H/o bipolar/depression--stable on no meds      Desires Nexplanon  P:  1.  Abstinence until Nexplanon insertion--rev'd insertion procedure and rec'd Motrin & meal prior to appt.      2.  Pt to check at pharmacy r/e Valtrex and how much she can purchase between now and end of Sept when pregnancy Medicaid runs out; pt desires to continue daily suppressive therapy      3. Rec'd continued wt loss, daily vit, kegels, healthy lifestyle, sunblock; support given r/e family and adoption 4.  F/u 1st available Nexplanon appt, or prn 5.  Plan next pap on or after 11/05/12  C. Denny Levy, [place] 03/13/12

## 2012-04-08 ENCOUNTER — Encounter (HOSPITAL_COMMUNITY): Payer: Self-pay | Admitting: *Deleted

## 2012-04-08 ENCOUNTER — Inpatient Hospital Stay (HOSPITAL_COMMUNITY)
Admission: AD | Admit: 2012-04-08 | Discharge: 2012-04-08 | Disposition: A | Discharge status: Home / Self Care | Payer: Medicaid Other | Source: Ambulatory Visit | Attending: Obstetrics and Gynecology | Admitting: Obstetrics and Gynecology

## 2012-04-08 DIAGNOSIS — N390 Urinary tract infection, site not specified: Secondary | ICD-10-CM | POA: Insufficient documentation

## 2012-04-08 DIAGNOSIS — R300 Dysuria: Secondary | ICD-10-CM | POA: Insufficient documentation

## 2012-04-08 HISTORY — DX: Herpesviral infection, unspecified: B00.9

## 2012-04-08 LAB — URINALYSIS, ROUTINE W REFLEX MICROSCOPIC
Bilirubin Urine: NEGATIVE
Ketones, ur: NEGATIVE mg/dL
Nitrite: NEGATIVE
Specific Gravity, Urine: 1.02 (ref 1.005–1.030)
Urobilinogen, UA: 0.2 mg/dL (ref 0.0–1.0)

## 2012-04-08 MED ORDER — PHENAZOPYRIDINE HCL 200 MG PO TABS: 200.0000 mg | ORAL_TABLET | Freq: Three times a day (TID) | ORAL | Status: DC | PRN

## 2012-04-08 MED ORDER — SULFAMETHOXAZOLE-TRIMETHOPRIM 800-160 MG PO TABS: 1.0000 | ORAL_TABLET | Freq: Two times a day (BID) | ORAL | Status: AC

## 2012-04-08 MED ORDER — SULFAMETHOXAZOLE-TRIMETHOPRIM 800-160 MG PO TABS: 1.0000 | ORAL_TABLET | Freq: Two times a day (BID) | ORAL | Status: DC

## 2012-04-08 NOTE — MAU Note (Signed)
Provider changed in computer from [place] to [place]; had 6 week postpartum check 3 weeks ago in [facility]'s office;

## 2012-04-08 NOTE — MAU Note (Signed)
Patient states she started having pain and frequency with urination this morning.

## 2012-04-08 NOTE — MAU Note (Signed)
Having vaginal pressure and urge to urinate with only trickles when she does void; G1P1- delivered vaginally  02-03-12 ([facility]); has not had a period since her delivery

## 2012-04-08 NOTE — MAU Provider Note (Signed)
  History     CSN: [id number]  Arrival date and time: 04/08/12 1914   First Provider Initiated Contact with Patient 04/08/12 1035      Chief Complaint  Patient presents with  . Dysuria   HPI Tonya Ellis is 22 y.o. G1P1001 Unknown weeks presenting with frequency, voiding small amount of urine, suprapubic discomfort that began this morning.  Denies fever, chills, or hematuria.  [name], CNM in the OR and asked that I complete MSE.  Patient is stable, waiting for  UA result.      Past Medical History  Diagnosis Date  . H/O varicella   . Bipolar disorder   . Pyelonephritis during pregnancy, antepartum 2013  . Depression     bipolar, no meds- doing well  . HSV-2 infection     Past Surgical History  Procedure Date  . No past surgeries     Family History  Problem Relation Age of Onset  . Anesthesia problems Neg Hx   . Other Neg Hx   . Mental illness Mother   . Drug abuse Father   . Cancer Maternal Grandmother     breast   . Mental illness Maternal Grandmother   . Diabetes Paternal Grandfather     History  Substance Use Topics  . Smoking status: Never Smoker   . Smokeless tobacco: Never Used  . Alcohol Use: No    Allergies: No Known Allergies  Prescriptions prior to admission  Medication Sig Dispense Refill  . Prenatal Vit-Fe Fumarate-FA (PRENATAL MULTIVITAMIN) TABS Take 1 tablet by mouth at bedtime.       . valACYclovir (VALTREX) 500 MG tablet Take 1 tablet bid po for 3 days during outbreak. Take 1 tablet po daily for suppression.  90 tablet  4    ROS Physical Exam   Blood pressure 116/68, pulse 95, temperature 98.7 F (37.1 C), temperature source Oral, resp. rate 16, height 5' 4.5" (1.638 m), weight 93.985 kg (207 lb 3.2 oz), SpO2 98.00%, not currently breastfeeding.  Physical Exam  MAU Course  Procedures  MDM   Assessment and Plan    Kaiea Esselman,EVE M 04/08/2012, 10:36 AM
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
## 2012-04-19 ENCOUNTER — Ambulatory Visit (INDEPENDENT_AMBULATORY_CARE_PROVIDER_SITE_OTHER): Payer: Medicaid Other | Admitting: Obstetrics and Gynecology

## 2012-04-19 ENCOUNTER — Encounter: Payer: Self-pay | Admitting: Obstetrics and Gynecology

## 2012-04-19 VITALS — BP 110/70 | HR 74 | Wt 206.0 lb

## 2012-04-19 DIAGNOSIS — Z30017 Encounter for initial prescription of implantable subdermal contraceptive: Secondary | ICD-10-CM

## 2012-04-19 LAB — POCT URINE PREGNANCY: Preg Test, Ur: NEGATIVE

## 2012-04-19 MED ORDER — ETONOGESTREL 68 MG ~~LOC~~ IMPL
68.0000 mg | DRUG_IMPLANT | Freq: Once | SUBCUTANEOUS | Status: AC
  Administered 2012-04-19: 68 mg via SUBCUTANEOUS

## 2012-04-19 NOTE — Addendum Note (Signed)
Addended byWinfred Leeds on: 04/19/2012 03:41 PM   Modules accepted: Orders

## 2012-04-19 NOTE — Progress Notes (Signed)
21 YO for Nexplanon insertion. Reviewed insertion, MOA, side effects, risks and benefits along with duration of action.  Patient had no questions.  Consent signed.  O: Nexplanon inserted per protocol in medial left upper arm without difficulty (patient had to receive 8 cc of 1% Lidocaine for anesthesia);  dressed with sterile       band-aid, 4 x 4 and Kling pressure dressing  UPT-negative  A: Nexplanon insertion  P: RTO- 1 week     Reviewed signs and symptoms of infection and wound care  Braedin Millhouse, PA-C

## 2012-04-19 NOTE — Patient Instructions (Addendum)
Keep follow up appointment in 1 week  Call [facility] [phone]:  -for temperature of 100.4 degrees Fahrenheit or more -pain not improved with over the counter pain medications (Ibuprofen, Advil, Aleve,     Tylenol or acetaminophen) -for excessive bleeding from insertion site -for excessive swelling redness or green drainage from your insertion site -for any other concerns -keep insertion site clean, dry and covered  for 24 hours -you may remove pressure bandage in 1-4 hours  Use a back-up method of birth control for the next 4 weeks

## 2012-05-02 ENCOUNTER — Telehealth: Payer: Self-pay | Admitting: Obstetrics and Gynecology

## 2012-05-02 ENCOUNTER — Inpatient Hospital Stay (HOSPITAL_COMMUNITY)
Admission: AD | Admit: 2012-05-02 | Discharge: 2012-05-02 | Disposition: A | Discharge status: Home / Self Care | Payer: Medicaid Other | Source: Ambulatory Visit | Attending: Obstetrics & Gynecology | Admitting: Obstetrics & Gynecology

## 2012-05-02 ENCOUNTER — Encounter (HOSPITAL_COMMUNITY): Payer: Self-pay | Admitting: *Deleted

## 2012-05-02 DIAGNOSIS — N390 Urinary tract infection, site not specified: Secondary | ICD-10-CM | POA: Insufficient documentation

## 2012-05-02 LAB — URINE MICROSCOPIC-ADD ON

## 2012-05-02 LAB — URINALYSIS, ROUTINE W REFLEX MICROSCOPIC
Nitrite: NEGATIVE
Specific Gravity, Urine: 1.01 (ref 1.005–1.030)
pH: 6.5 (ref 5.0–8.0)

## 2012-05-02 MED ORDER — PHENAZOPYRIDINE HCL 100 MG PO TABS: 100.0000 mg | ORAL_TABLET | Freq: Three times a day (TID) | ORAL | Status: DC | PRN

## 2012-05-02 MED ORDER — SULFAMETHOXAZOLE-TRIMETHOPRIM 800-160 MG PO TABS: 1.0000 | ORAL_TABLET | Freq: Two times a day (BID) | ORAL | Status: DC

## 2012-05-02 NOTE — MAU Note (Signed)
Patient taken directly to room 4 from lobby. 

## 2012-05-02 NOTE — MAU Note (Signed)
Urinary frequency, mild pain with urination today.

## 2012-05-02 NOTE — Telephone Encounter (Signed)
Tc to pt per telephone call. Pt informed needs appt for eval to r/o UTI. Pt states,"I don't have any insurance". Informed pt may consult with our billing dept or can go to ER for eval. Pt declines at this time.

## 2012-05-02 NOTE — MAU Provider Note (Signed)
Attestation of Attending Supervision of Advanced Practitioner (CNM/NP): Evaluation and management procedures were performed by the Advanced Practitioner under my supervision and collaboration.  I have reviewed the Advanced Practitioner's note and chart, and I agree with the management and plan.  Anuel Sitter, MD, FACOG Attending Obstetrician & Gynecologist Faculty Practice, [facility]  

## 2012-05-02 NOTE — MAU Provider Note (Signed)
History     CSN: [id number]  Arrival date and time: 05/02/12 1415   First Provider Initiated Contact with Patient 05/02/12 1511      Chief Complaint  Patient presents with  . Dysuria   HPI This is a 21 y.o. female who presents with bladder pain. She does not have significant dysuria. Denies fever, nausea or vomiting. Has recently been a patient of [facility], but has a large balance there and is not a current patient.   OB History    Grav Para Term Preterm Abortions TAB SAB Ect Mult Living   1 1 1  0 0 0 0 0 0 1      Past Medical History  Diagnosis Date  . H/O varicella   . Bipolar disorder   . Pyelonephritis during pregnancy, antepartum 2013  . Depression     bipolar, no meds- doing well  . HSV-2 infection     Past Surgical History  Procedure Date  . Wisdom tooth extraction     Family History  Problem Relation Age of Onset  . Anesthesia problems Neg Hx   . Other Neg Hx   . Mental illness Mother   . Drug abuse Father   . Cancer Maternal Grandmother     breast   . Mental illness Maternal Grandmother   . Diabetes Paternal Grandfather     History  Substance Use Topics  . Smoking status: Never Smoker   . Smokeless tobacco: Never Used  . Alcohol Use: No    Allergies: No Known Allergies  Prescriptions prior to admission  Medication Sig Dispense Refill  . OVER THE COUNTER MEDICATION Take 1 tablet by mouth daily. Patient takes cranberry pills,sometimes takes up to 3 a day      . valACYclovir (VALTREX) 500 MG tablet Take 1 tablet bid po for 3 days during outbreak. Take 1 tablet po daily for suppression.  90 tablet  4  . etonogestrel (NEXPLANON) 68 MG IMPL implant Inject 1 each into the skin once.        ROS See HPI  Physical Exam   Blood pressure 116/70, pulse 68, temperature 98.4 F (36.9 C), temperature source Oral, resp. rate 16, last menstrual period 04/18/2012, unknown if currently breastfeeding.  Physical Exam  Constitutional: She is oriented to  person, place, and time. She appears well-developed and well-nourished. No distress.  Cardiovascular: Normal rate.   Respiratory: Effort normal.  GI: Soft. She exhibits no distension and no mass. There is tenderness (over bladder). There is no rebound and no guarding.       Mild CVAT over right kidney  Musculoskeletal: Normal range of motion.  Neurological: She is alert and oriented to person, place, and time.  Skin: Skin is warm and dry.  Psychiatric: She has a normal mood and affect.   Results for orders placed during the hospital encounter of 05/02/12 (from the past 24 hour(s))  URINALYSIS, ROUTINE W REFLEX MICROSCOPIC     Status: Abnormal   Collection Time   05/02/12  2:20 PM      Component Value Range   Color, Urine YELLOW  YELLOW   APPearance CLEAR  CLEAR   Specific Gravity, Urine 1.010  1.005 - 1.030   pH 6.5  5.0 - 8.0   Glucose, UA NEGATIVE  NEGATIVE mg/dL   Hgb urine dipstick NEGATIVE  NEGATIVE   Bilirubin Urine NEGATIVE  NEGATIVE   Ketones, ur NEGATIVE  NEGATIVE mg/dL   Protein, ur NEGATIVE  NEGATIVE mg/dL   Urobilinogen,  UA 0.2  0.0 - 1.0 mg/dL   Nitrite NEGATIVE  NEGATIVE   Leukocytes, UA SMALL (*) NEGATIVE  URINE MICROSCOPIC-ADD ON     Status: Abnormal   Collection Time   05/02/12  2:20 PM      Component Value Range   Squamous Epithelial / LPF RARE  RARE   WBC, UA 11-20  <3 WBC/hpf   RBC / HPF    <3 RBC/hpf   Value: NO FORMED ELEMENTS SEEN ON URINE MICROSCOPIC EXAMINATION   Bacteria, UA FEW (*) RARE  POCT PREGNANCY, URINE     Status: Normal   Collection Time   05/02/12  2:52 PM      Component Value Range   Preg Test, Ur NEGATIVE  NEGATIVE     MAU Course  Procedures   Assessment and Plan  A:  Presumed UTI      History of pylelonephritis  P:  Discussed findings are not conclusive for UTI.  Pt desires treatment      Urine to culture      Rx Septra and pyridium      Warned about antibiotic resistance, offered option  Of waiting for culture results.       Follow up as needed  [facility] 05/02/2012, 3:40 PM

## 2012-05-03 LAB — URINE CULTURE

## 2012-09-06 ENCOUNTER — Ambulatory Visit: Payer: Self-pay | Admitting: Family Medicine

## 2012-09-06 VITALS — BP 107/73 | HR 80 | Temp 97.9°F | Resp 16 | Ht 65.8 in | Wt 220.8 lb

## 2012-09-06 DIAGNOSIS — J069 Acute upper respiratory infection, unspecified: Secondary | ICD-10-CM

## 2012-09-06 MED ORDER — AMOXICILLIN 500 MG PO CAPS: 1000.0000 mg | ORAL_CAPSULE | Freq: Two times a day (BID) | ORAL | Status: DC

## 2012-09-06 NOTE — Patient Instructions (Addendum)
You most likely have a viral infection.  Continue to push fluids, take the mucinex and you can also try ibuprofen/ tylenol for your pain.   If you are not better in the next few days you can start the amoxicillin rx. Let me know if you are getting worse or are not improved in the next week or so.
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
## 2012-09-24 ENCOUNTER — Encounter: Payer: Self-pay | Admitting: Obstetrics and Gynecology

## 2012-09-24 ENCOUNTER — Ambulatory Visit: Payer: Self-pay | Admitting: Obstetrics and Gynecology

## 2012-09-24 VITALS — BP 104/70 | Wt 226.0 lb

## 2012-09-24 DIAGNOSIS — Z3046 Encounter for surveillance of implantable subdermal contraceptive: Secondary | ICD-10-CM

## 2012-09-24 MED ORDER — NORETHIN ACE-ETH ESTRAD-FE 1-20 MG-MCG PO TABS: 1.0000 | ORAL_TABLET | Freq: Every day | ORAL | Status: DC

## 2012-09-24 NOTE — Progress Notes (Signed)
22 YO for Nexplanon removal and an alternative method of contraception.  Patient had the Nexplanon placed September 2013 and wants it removed due to weight gain and shooting pains in arm.  Patient has reviewed methods of contraception handout and would like to take a low dose combnation BCP.  Has been on pills before but doesn't recall the name.  O:  Nexplanon removed without difficulty from medial left upper arm per protocol, incision closed with steri-strips and dressed with sterile band-aids/ 4x4 gauze and kling pressure dressing   UPT-negative  A: Nexplanon Removal     Need for Contraception  P: Junel 1/20  #1 1 po qd 11 refills; use back up method x 1 month       BCP instruction sheet given       Call [facility] [phone]:  -for temperature of 100.4 degrees Fahrenheit or more -pain not improved with over the counter pain medications (Ibuprofen, Advil, Aleve,     Tylenol or acetaminophen) -for excessive bleeding from insertion site -for excessive swelling redness or green drainage from your insertion site -for any other concerns -keep insertion site clean, dry and covered  for 48 hours -you may remove pressure bandage in 1-4 hours  Use a back-up method of birth control for the next 4 weeks        RTO-1 week follow up Nexplanon removal  POWELL,ELMIRA\, PA-C

## 2012-09-24 NOTE — Patient Instructions (Signed)
Call [facility] [phone]:  -for temperature of 100.4 degrees Fahrenheit or more -pain not improved with over the counter pain medications (Ibuprofen, Advil, Aleve,     Tylenol or acetaminophen) -for excessive bleeding from insertion site -for excessive swelling redness or green drainage from your insertion site -for any other concerns -keep insertion site clean, dry and covered  for 48 hours -you may remove pressure bandage in 1-4 hours  Use a back-up method of birth control for the next 4 weeks  

## 2013-04-03 ENCOUNTER — Ambulatory Visit: Payer: Self-pay

## 2013-04-03 ENCOUNTER — Ambulatory Visit: Payer: Self-pay | Admitting: Family Medicine

## 2013-04-03 VITALS — BP 118/64 | HR 71 | Temp 99.0°F | Resp 18 | Ht 65.8 in | Wt 207.8 lb

## 2013-04-03 DIAGNOSIS — R109 Unspecified abdominal pain: Secondary | ICD-10-CM

## 2013-04-03 DIAGNOSIS — K5900 Constipation, unspecified: Secondary | ICD-10-CM

## 2013-04-03 LAB — POCT URINALYSIS DIPSTICK
Glucose, UA: NEGATIVE
Spec Grav, UA: 1.01

## 2013-04-03 LAB — POCT UA - MICROSCOPIC ONLY
Bacteria, U Microscopic: NEGATIVE
Casts, Ur, LPF, POC: NEGATIVE

## 2013-04-03 MED ORDER — POLYETHYLENE GLYCOL 3350 17 GM/SCOOP PO POWD: 17.0000 g | Freq: Two times a day (BID) | ORAL | Status: DC

## 2013-04-03 MED ORDER — VALACYCLOVIR HCL 500 MG PO TABS: ORAL_TABLET | ORAL | Status: DC

## 2013-04-03 NOTE — Patient Instructions (Addendum)
I recommend doing a Fleet's enema today - you may need to repeat this tomorrow. Continue taking magnesium citrate solution today (and repeat as necessary).  Use the Miralax 17g (1 capful) mixed into 8 oz of any clear liquid twice a day until you achieve complete relief and up to three times a day as needed.  Other over the counter remedies you should consider using in the future as soon as you notice any constipation developing is Colace (docusate) 100mg  twice a day which keeps your stools soft and  Senokot S can help make sure your bowels are actively moving as well as keep your stools soft. Make sure you are doing several Sitz baths (warm water baths - add Epsom salts if you would prefer) to help prevent/heal any developing hemorrhoids or fissures from the constipation.   Constipation, Adult Constipation is when a person has fewer than 3 bowel movements a week; has difficulty having a bowel movement; or has stools that are dry, hard, or larger than normal. As people grow older, constipation is more common. If you try to fix constipation with medicines that make you have a bowel movement (laxatives), the problem may get worse. Long-term laxative use may cause the muscles of the colon to become weak. A low-fiber diet, not taking in enough fluids, and taking certain medicines may make constipation worse. CAUSES   Certain medicines, such as antidepressants, pain medicine, iron supplements, antacids, and water pills.   Certain diseases, such as diabetes, irritable bowel syndrome (IBS), thyroid disease, or depression.   Not drinking enough water.   Not eating enough fiber-rich foods.   Stress or travel.  Lack of physical activity or exercise.  Not going to the restroom when there is the urge to have a bowel movement.  Ignoring the urge to have a bowel movement.  Using laxatives too much. SYMPTOMS   Having fewer than 3 bowel movements a week.   Straining to have a bowel movement.    Having hard, dry, or larger than normal stools.   Feeling full or bloated.   Pain in the lower abdomen.  Not feeling relief after having a bowel movement. DIAGNOSIS  Your caregiver will take a medical history and perform a physical exam. Further testing may be done for severe constipation. Some tests may include:   A barium enema X-ray to examine your rectum, colon, and sometimes, your small intestine.  A sigmoidoscopy to examine your lower colon.  A colonoscopy to examine your entire colon. TREATMENT  Treatment will depend on the severity of your constipation and what is causing it. Some dietary treatments include drinking more fluids and eating more fiber-rich foods. Lifestyle treatments may include regular exercise. If these diet and lifestyle recommendations do not help, your caregiver may recommend taking over-the-counter laxative medicines to help you have bowel movements. Prescription medicines may be prescribed if over-the-counter medicines do not work.  HOME CARE INSTRUCTIONS   Increase dietary fiber in your diet, such as fruits, vegetables, whole grains, and beans. Limit high-fat and processed sugars in your diet, such as [place] fries, hamburgers, cookies, candies, and soda.   A fiber supplement may be added to your diet if you cannot get enough fiber from foods.   Drink enough fluids to keep your urine clear or pale yellow.   Exercise regularly or as directed by your caregiver.   Go to the restroom when you have the urge to go. Do not hold it.  Only take medicines as directed by your  caregiver. Do not take other medicines for constipation without talking to your caregiver first. SEEK IMMEDIATE MEDICAL CARE IF:   You have bright red blood in your stool.   Your constipation lasts for more than 4 days or gets worse.   You have abdominal or rectal pain.   You have thin, pencil-like stools.  You have unexplained weight loss. MAKE SURE YOU:   Understand  these instructions.  Will watch your condition.  Will get help right away if you are not doing well or get worse. Document Released: 04/07/2004 Document Revised: 10/02/2011 Document Reviewed: 06/13/2011 [facility] Patient Information 2014 [place], [place]. Fecal Impaction A fecal impaction happens when there is a large, firm amount of stool (poop) that cannot be passed. The impacted stool is usually in the rectum, which is the lowest part of the large bowel. The impacted stool can block the colon and cause significant problems. CAUSES  The longer stool stays in the rectum, the harder it gets. Anything that slows down your bowel movements can lead to fecal impaction. These conditions include:  Constipation (having firm hard stools). This can be a longstanding (chronic) problem, or can happen suddenly (acutely).  Painful conditions of the rectum, such as hemorrhoids or anal fissures. The pain of these conditions can make you try to avoid having bowel movements.  Narcotic pain medications cause constipation, which can sometimes be severe. If you take narcotic pain medication, you should also talk with your caregiver about preventing constipation.  Not getting enough fluids.  Inactivity and bed rest over long periods of time. SYMPTOMS  Some symptoms of fecal impaction include:  Lack of normal bowel movements or changes in bowel patterns.  Sense of fullness in the rectum, but unable to pass stool.  Pain or cramps in the stomach or abdominal area (often after meals).  Thin, watery discharge from the rectum. Without treatment, a fecal impaction can block the colon and cause severe abdominal pain or colon tears (perforation). This may lead to surgery.  DIAGNOSIS  Fecal impaction is suspected based upon your symptoms and upon a physical examination. This will include an exam of your rectum, which can confirm the diagnosis. Sometimes x-rays or lab testing may be needed to confirm the diagnosis  and be sure there are no other problems.  TREATMENT   Initially an impaction can be removed manually. Your caregiver, using a gloved finger, can remove hard stool from your rectum.  Medication is sometimes needed. A suppository or enema can be given in the rectum to soften the stool. This can stimulate a bowel movement. Medicines can also be given by mouth (orally).  Surgery may be needed if the colon has torn (perforated) due to blockage. This is very rare. HOME CARE INSTRUCTIONS   Develop regular bowel habits. This may be something such as getting in the habit of having a bowel movement after your morning cup of coffee or after eating. Be sure to allow yourself enough time on the toilet.  Maintain a high fiber diet.  Drink plenty of fluids each day. This is especially true for the elderly and especially during cold weather when thirst may not be as strong. Try to take in at least eight, 8 ounce glasses of water daily unless instructed otherwise.  Exercise regularly.  If you begin to get constipated, increase the amount of fiber in your diet. Eat plenty of fruits, vegetables, whole wheat breads, bran, oatmeal and similar products.  Natural fiber laxatives such as Metamucil are also very  helpful.  Speak with your caregiver if you suspect medications may be causing constipation. SEEK MEDICAL CARE IF:   You have ongoing constipation or a hard time passing your stools.  You have ongoing rectal pain.  You require enemas or suppositories more than twice a week. SEEK IMMEDIATE MEDICAL CARE IF:   There are continued problems or you develop abdominal pain.  You develop rectal bleeding.  You develop black or tarry stools or feel lightheaded. MAKE SURE YOU:   Understand these instructions.  Will watch your condition.  Will get help right away if you are not doing well or get worse. Document Released: 04/01/2004 Document Revised: 10/02/2011 Document Reviewed: 03/19/2008 [facility]  Patient Information 2014 [place], [place].

## 2013-04-03 NOTE — Progress Notes (Signed)
Subjective:    Patient ID: Tonya Ellis, female    DOB: Jul 07, 1991, 22 y.o.   MRN: [id number] Chief Complaint  Patient presents with  . Abdominal Pain    Started a new diet and is having concerns about possible blockage  . Medication Refill    Valtrex    HPI  Here with her boyfriend. Started on [employer] weightloss which is supposed to maintain BM so placed on Intestinal Formula #1 and mag citrate 150mg  6 tabs daily. Also on hcg supplement. Has been having some pain and loose stool. H/o of constipation and blockages - at 22 yo - had a 20 lb blockage that she was put on laxatives for. Never had an actual firm BM with this - just passed a lot of oily diarrhea.  Has been on increasing discomfort - esp yesterday - maybe due to blockage moving.  Increase bowel sounds. No dysuria but is having urinary frequency and hesitancy. Trying to drink 100 oz of water. On menses currently and using OCPs so very sure she is not pregnant. On hcg supplement though for weight loss.  Past Medical History  Diagnosis Date  . H/O varicella   . Bipolar disorder   . Pyelonephritis during pregnancy, antepartum 2013  . Depression     bipolar, no meds- doing well  . HSV-2 infection    Current Outpatient Prescriptions on File Prior to Visit  Medication Sig Dispense Refill  . Multiple Vitamins-Minerals (MULTIVITAMIN PO) Take 1 tablet by mouth daily.      . norethindrone-ethinyl estradiol (JUNEL FE,GILDESS FE,LOESTRIN FE) 1-20 MG-MCG tablet Take 1 tablet by mouth daily.  1 Package  11  . valACYclovir (VALTREX) 500 MG tablet Take 1 tablet bid po for 3 days during outbreak. Take 1 tablet po daily for suppression.  90 tablet  4  . amoxicillin (AMOXIL) 500 MG capsule Take 2 capsules (1,000 mg total) by mouth 2 (two) times daily.  40 capsule  0  . etonogestrel (NEXPLANON) 68 MG IMPL implant Inject 1 each into the skin once.      Marland Kitchen OVER THE COUNTER MEDICATION Take 1 tablet by mouth daily. Patient takes cranberry  pills,sometimes takes up to 3 a day       No current facility-administered medications on file prior to visit.   No Known Allergies   Review of Systems  Constitutional: Positive for appetite change. Negative for fever, chills, diaphoresis and activity change.  Gastrointestinal: Positive for abdominal pain and constipation. Negative for nausea, vomiting and diarrhea.  Genitourinary: Positive for frequency and difficulty urinating. Negative for dysuria, decreased urine volume, vaginal discharge and pelvic pain.  Musculoskeletal: Negative for joint swelling and gait problem.  Skin: Negative for pallor and wound.  Hematological: Negative for adenopathy.      BP 118/64  Pulse 71  Temp(Src) 99 F (37.2 C) (Oral)  Resp 18  Ht 5' 5.8" (1.671 m)  Wt 207 lb 12.8 oz (94.257 kg)  BMI 33.76 kg/m2  SpO2 99% Objective:   Physical Exam  Constitutional: She is oriented to person, place, and time. She appears well-developed and well-nourished. No distress.  HENT:  Head: Normocephalic and atraumatic.  Neck: Normal range of motion. Neck supple. No thyromegaly present.  Cardiovascular: Normal rate, regular rhythm, normal heart sounds and intact distal pulses.   Pulmonary/Chest: Effort normal and breath sounds normal. No respiratory distress.  Abdominal: Soft. Normal appearance. She exhibits no distension, no pulsatile midline mass and no mass. Bowel sounds are decreased. There is  no hepatosplenomegaly. There is tenderness in the right upper quadrant, epigastric area, periumbilical area, suprapubic area and left lower quadrant. There is no rigidity, no rebound, no guarding and no CVA tenderness. No hernia.  Musculoskeletal: She exhibits no edema.  Lymphadenopathy:    She has no cervical adenopathy.  Neurological: She is alert and oriented to person, place, and time.  Skin: Skin is warm and dry. She is not diaphoretic. No erythema.  Psychiatric: She has a normal mood and affect. Her behavior is  normal.      Results for orders placed in visit on 04/03/13  POCT UA - MICROSCOPIC ONLY      Result Value Range   WBC, Ur, HPF, POC neg     RBC, urine, microscopic 0-2     Bacteria, U Microscopic neg     Mucus, UA neg     Epithelial cells, urine per micros 1-3     Crystals, Ur, HPF, POC neg     Casts, Ur, LPF, POC neg     Yeast, UA neg    POCT URINALYSIS DIPSTICK      Result Value Range   Color, UA yellow     Clarity, UA clear     Glucose, UA neg     Bilirubin, UA neg     Ketones, UA trace     Spec Grav, UA 1.010     Blood, UA small     pH, UA 6.5     Protein, UA neg     Urobilinogen, UA 0.2     Nitrite, UA neg     Leukocytes, UA Negative    [facility] reading (PRIMARY) by  Dr. Clelia Croft. Abd xray: Nonspecific bowel gas pattern, few small air-fluid levels, constipation with stool though descending colon and rectum  Assessment & Plan:  Abdominal  pain, other specified site - Plan: POCT UA - Microscopic Only, POCT urinalysis dipstick, DG Abd 2 Views, Ambulatory referral to Gastroenterology  Unspecified constipation - Plan: Ambulatory referral to Gastroenterology - poss made worse by hcg supp? Ok to cont on magnesium and intestinal "formula #1" supplement for an additional wk to finish 6 wk course.  Recommend enema and miralax in addition - then can augment with colace or senokot as needed in the future. Refer to GI for further eval and treatment of her chronic - likely life-long - constipation. Meds ordered this encounter  Medications  . valACYclovir (VALTREX) 500 MG tablet    Sig: Take 1 tablet bid po for 3 days during outbreak. Take 1 tablet po daily for suppression.    Dispense:  90 tablet    Refill:  4  . polyethylene glycol powder (GLYCOLAX/MIRALAX) powder    Sig: Take 17 g by mouth 2 (two) times daily.    Dispense:  527 g    Refill:  1

## 2013-11-10 ENCOUNTER — Encounter: Payer: Self-pay | Admitting: Family Medicine

## 2013-11-10 ENCOUNTER — Encounter: Payer: Self-pay | Admitting: Physician Assistant

## 2013-11-10 ENCOUNTER — Ambulatory Visit (INDEPENDENT_AMBULATORY_CARE_PROVIDER_SITE_OTHER): Payer: No Typology Code available for payment source | Admitting: Family Medicine

## 2013-11-10 VITALS — BP 122/78 | HR 68 | Temp 99.0°F | Resp 16 | Ht 65.0 in | Wt 213.0 lb

## 2013-11-10 DIAGNOSIS — R635 Abnormal weight gain: Secondary | ICD-10-CM

## 2013-11-10 DIAGNOSIS — Z1329 Encounter for screening for other suspected endocrine disorder: Secondary | ICD-10-CM

## 2013-11-10 DIAGNOSIS — Z0000 Encounter for general adult medical examination without abnormal findings: Secondary | ICD-10-CM

## 2013-11-10 DIAGNOSIS — Z113 Encounter for screening for infections with a predominantly sexual mode of transmission: Secondary | ICD-10-CM

## 2013-11-10 DIAGNOSIS — Z13220 Encounter for screening for lipoid disorders: Secondary | ICD-10-CM

## 2013-11-10 LAB — COMPREHENSIVE METABOLIC PANEL
ALBUMIN: 4 g/dL (ref 3.5–5.2)
ALK PHOS: 42 U/L (ref 39–117)
ALT: 32 U/L (ref 0–35)
AST: 20 U/L (ref 0–37)
BUN: 11 mg/dL (ref 6–23)
CO2: 25 mEq/L (ref 19–32)
Calcium: 9.1 mg/dL (ref 8.4–10.5)
Chloride: 104 mEq/L (ref 96–112)
Creat: 0.73 mg/dL (ref 0.50–1.10)
Glucose, Bld: 82 mg/dL (ref 70–99)
POTASSIUM: 4.6 meq/L (ref 3.5–5.3)
SODIUM: 138 meq/L (ref 135–145)
TOTAL PROTEIN: 6.8 g/dL (ref 6.0–8.3)
Total Bilirubin: 0.5 mg/dL (ref 0.2–1.2)

## 2013-11-10 LAB — TSH: TSH: 1.43 u[IU]/mL (ref 0.350–4.500)

## 2013-11-10 LAB — HIV ANTIBODY (ROUTINE TESTING W REFLEX): HIV 1&2 Ab, 4th Generation: NONREACTIVE

## 2013-11-10 LAB — LIPID PANEL
Cholesterol: 196 mg/dL (ref 0–200)
HDL: 66 mg/dL (ref >39)
LDL CALC: 110 mg/dL — AB (ref 0–99)
Total CHOL/HDL Ratio: 3 Ratio
Triglycerides: 99 mg/dL (ref <150)
VLDL: 20 mg/dL (ref 0–40)

## 2013-11-10 LAB — CBC
HCT: 40.3 % (ref 36.0–46.0)
HEMOGLOBIN: 13.5 g/dL (ref 12.0–15.0)
MCH: 27.9 pg (ref 26.0–34.0)
MCHC: 33.5 g/dL (ref 30.0–36.0)
MCV: 83.3 fL (ref 78.0–100.0)
PLATELETS: 385 10*3/uL (ref 150–400)
RBC: 4.84 MIL/uL (ref 3.87–5.11)
RDW: 13.1 % (ref 11.5–15.5)
WBC: 8 10*3/uL (ref 4.0–10.5)

## 2013-11-10 LAB — HEPATITIS B SURFACE ANTIBODY, QUANTITATIVE: Hepatitis B-Post: 0 m[IU]/mL

## 2013-11-10 LAB — HEPATITIS B SURFACE ANTIGEN: HEP B S AG: NEGATIVE

## 2013-11-10 LAB — RPR

## 2013-11-10 LAB — HEPATITIS C ANTIBODY: HCV Ab: NEGATIVE
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
## 2013-11-10 NOTE — Progress Notes (Deleted)
Patient ID: Tonya RasherCarissa Ellis MRN: [id number], DOB: 1991/04/15, 23 y.o. Date of Encounter: 11/10/2013, 8:31 AM  Primary Physician: Hal MoralesHAYGOOD,VANESSA P, MD  Chief Complaint: Physical (CPE)  HPI: 23 y.o. female with history of noted below here for CPE. Doing well. Last physical was ***.  LMP: *** Pap: MMG: Review of Systems:*** Consitutional: No fever, chills, fatigue, night sweats, lymphadenopathy, or weight changes. Eyes: No visual changes, eye redness, or discharge. ENT/Mouth: Ears: No otalgia, tinnitus, hearing loss, discharge. Nose: No congestion, rhinorrhea, sinus pain, or epistaxis. Throat: No sore throat, post nasal drip, or teeth pain. Cardiovascular: No CP, palpitations, diaphoresis, DOE, edema, orthopnea, PND. Respiratory: No cough, hemoptysis, SOB, or wheezing. Gastrointestinal: No anorexia, dysphagia, reflux, pain, nausea, vomiting, hematemesis, diarrhea, constipation, BRBPR, or melena. Breast: No discharge, pain, swelling, or mass. Genitourinary: No dysuria, frequency, urgency, hematuria, incontinence, nocturia, amenorrhea, vaginal discharge, pruritis, burning, abnormal bleeding, or pain. Musculoskeletal: No decreased ROM, myalgias, stiffness, joint swelling, or weakness. Skin: No rash, erythema, lesion changes, pain, warmth, jaundice, or pruritis. Neurological: No headache, dizziness, syncope, seizures, tremors, memory loss, coordination problems, or paresthesias. Psychological: No anxiety, depression, hallucinations, SI/HI. Endocrine: No fatigue, polydipsia, polyphagia, polyuria, or known diabetes.   Past Medical History  Diagnosis Date  . H/O varicella   . Bipolar disorder   . Pyelonephritis during pregnancy, antepartum 2013  . Depression     bipolar, no meds- doing well  . HSV-2 infection      Past Surgical History  Procedure Laterality Date  . Wisdom tooth extraction      Home Meds:  Prior to Admission medications   Medication Sig Start Date End Date Taking?  Authorizing Provider  Multiple Vitamins-Minerals (MULTIVITAMIN PO) Take 1 tablet by mouth daily.    Historical Provider, MD  norethindrone-ethinyl estradiol (JUNEL FE,GILDESS FE,LOESTRIN FE) 1-20 MG-MCG tablet Take 1 tablet by mouth daily. 09/24/12   Henreitta LeberElmira Powell, PA-C  OVER THE COUNTER MEDICATION Take 1 tablet by mouth daily. Patient takes cranberry pills,sometimes takes up to 3 a day    Historical Provider, MD  polyethylene glycol powder (GLYCOLAX/MIRALAX) powder Take 17 g by mouth 2 (two) times daily. 04/03/13   Sherren MochaEva N Shaw, MD  valACYclovir (VALTREX) 500 MG tablet Take 1 tablet bid po for 3 days during outbreak. Take 1 tablet po daily for suppression. 04/03/13   Sherren MochaEva N Shaw, MD    Allergies: No Known Allergies  History   Social History  . Marital Status: Single    Spouse Name: N/A    Number of Children: N/A  . Years of Education: N/A   Occupational History  . Not on file.   Social History Main Topics  . Smoking status: Never Smoker   . Smokeless tobacco: Never Used  . Alcohol Use: No  . Drug Use: No  . Sexual Activity: Yes    Partners: Male     Comment:    Other Topics Concern  . Not on file   Social History Narrative  . No narrative on file    Family History  Problem Relation Age of Onset  . Anesthesia problems Neg Hx   . Other Neg Hx   . Mental illness Mother   . Drug abuse Father   . Cancer Maternal Grandmother     breast   . Mental illness Maternal Grandmother   . Diabetes Paternal Grandfather     Physical Exam:*** There were no vitals taken for this visit., There is no weight on file to calculate BMI. General: Well developed,  well nourished, in no acute distress. HEENT: Normocephalic, atraumatic. Conjunctiva pink, sclera non-icteric. Pupils 2 mm constricting to 1 mm, round, regular, and equally reactive to light and accomodation. EOMI. Internal auditory canal clear. TMs with good cone of light and without pathology. Nasal mucosa pink. Nares are without  discharge. No sinus tenderness. Oral mucosa pink. Dentition***. Pharynx without exudate.   Neck: Supple. Trachea midline. No thyromegaly. Full ROM. No lymphadenopathy. Lungs: Clear to auscultation bilaterally without wheezes, rales, or rhonchi. Breathing is of normal effort and unlabored. Cardiovascular: RRR with S1 S2. No murmurs, rubs, or gallops appreciated. Distal pulses 2+ symmetrically. No carotid or abdominal bruits.*** Breast: Symmetrical. No masses. Nipples without discharge.*** Abdomen: Soft, non-tender, non-distended with normoactive bowel sounds. No hepatosplenomegaly or masses. No rebound/guarding. No CVA tenderness. Without hernias.  Genitourinary:  External genitalia without lesions. Vaginal mucosa pink. Cervix pink and without discharge. No cervical or adnexal tenderness. Pap smear taken*** Musculoskeletal: Full range of motion and 5/5 strength throughout. Without swelling, atrophy, tenderness, crepitus, or warmth. Extremities without clubbing, cyanosis, or edema. Calves supple. Skin: Warm and moist without erythema, ecchymosis, wounds, or rash. Neuro: A+Ox3. CN II-XII grossly intact. Moves all extremities spontaneously. Full sensation throughout. Normal gait. DTR 2+ throughout upper and lower extremities. Finger to nose intact. Psych:  Responds to questions appropriately with a normal affect.   Studies:   CMP, CBC, lipid, and TSH all pending. Patient is *** fasting.   Assessment/Plan:  23 y.o. female here for CPE*** - -Healthy diet and exercise -Weight loss -Age appropriate anticipatory guidance  Signed, Eula ListenRyan Lurae Hornbrook, PA-C [facility] [place], [place] [id number] [phone] 11/10/2013 8:31 AM

## 2013-11-10 NOTE — Patient Instructions (Signed)
I will be in touch with your labs.

## 2013-12-10 ENCOUNTER — Ambulatory Visit (INDEPENDENT_AMBULATORY_CARE_PROVIDER_SITE_OTHER): Payer: No Typology Code available for payment source | Admitting: Family Medicine

## 2013-12-10 VITALS — BP 124/76 | HR 90 | Temp 99.0°F | Resp 18 | Ht 64.5 in | Wt 211.0 lb

## 2013-12-10 DIAGNOSIS — J329 Chronic sinusitis, unspecified: Secondary | ICD-10-CM

## 2013-12-10 MED ORDER — AMOXICILLIN 875 MG PO TABS: 875.0000 mg | ORAL_TABLET | Freq: Two times a day (BID) | ORAL | Status: DC

## 2013-12-10 NOTE — Patient Instructions (Signed)

## 2013-12-10 NOTE — Progress Notes (Signed)
° °  Subjective:    Patient ID: Tonya Ellis, female    DOB: 1991/07/20, 23 y.o.   MRN: [id number]  Sinusitis Associated symptoms include congestion, coughing and a sore throat. Pertinent negatives include no chills.   Chief Complaint  Patient presents with   Sinusitis    cough sore throat x 1 day    This chart was scribed for Elvina SidleKurt Lauenstein, MD by Andrew Auaven Small, ED Scribe. This patient was seen in room 8 and the patient's care was started at 9:13 AM.  HPI Comments: Tonya Ellis is a 23 y.o. female who [facility] the [facility] complaining of sinusitis, onset a couples days. Pt reports mild productive cough, sore throat, post nasal drip, and nosebleeds. Pt has been unable to sleep due to symptoms. Pt denies fever. Pt denies asthma.    Past Medical History  Diagnosis Date   H/O varicella    Bipolar disorder    Pyelonephritis during pregnancy, antepartum 2013   Depression     bipolar, no meds- doing well   HSV-2 infection    No Known Allergies Prior to Admission medications   Medication Sig Start Date End Date Taking? Authorizing Provider  OVER THE COUNTER MEDICATION Take 1 tablet by mouth daily. Patient takes cranberry pills,sometimes takes up to 3 a day   Yes Historical Provider, MD  valACYclovir (VALTREX) 500 MG tablet Take 1 tablet bid po for 3 days during outbreak. Take 1 tablet po daily for suppression. 04/03/13  Yes Sherren MochaEva N Shaw, MD  Multiple Vitamins-Minerals (MULTIVITAMIN PO) Take 1 tablet by mouth daily.    Historical Provider, MD  norethindrone-ethinyl estradiol (JUNEL FE,GILDESS FE,LOESTRIN FE) 1-20 MG-MCG tablet Take 1 tablet by mouth daily. 09/24/12   Elmira Powell, PA-C  polyethylene glycol powder (GLYCOLAX/MIRALAX) powder Take 17 g by mouth 2 (two) times daily. 04/03/13   Sherren MochaEva N Shaw, MD   Review of Systems  Constitutional: Negative for fever and chills.  HENT: Positive for congestion, nosebleeds, postnasal drip and sore throat.   Respiratory:  Positive for cough.   Psychiatric/Behavioral: Negative for sleep disturbance.      Objective:   Physical Exam  Nursing note and vitals reviewed. Constitutional: She is oriented to person, place, and time. She appears well-developed and well-nourished. No distress.  HENT:  Head: Normocephalic and atraumatic.  Mouth/Throat: Posterior oropharyngeal erythema present.  Nasal passage swollen with mucous purulent dishcarge  Eyes: EOM are normal.  Neck: Neck supple. No tracheal deviation present.  Cardiovascular: Normal rate.   Pulmonary/Chest: Effort normal. No respiratory distress.  Musculoskeletal: Normal range of motion.  Neurological: She is alert and oriented to person, place, and time.  Skin: Skin is warm and dry.  Psychiatric: She has a normal mood and affect. Her behavior is normal.      Assessment & Plan:   1. Sinusitis    Meds ordered this encounter  Medications   amoxicillin (AMOXIL) 875 MG tablet    Sig: Take 1 tablet (875 mg total) by mouth 2 (two) times daily.    Dispense:  20 tablet    Refill:  0    Elvina SidleKurt Lauenstein, MD

## 2014-01-02 ENCOUNTER — Ambulatory Visit (INDEPENDENT_AMBULATORY_CARE_PROVIDER_SITE_OTHER): Payer: No Typology Code available for payment source | Admitting: Family Medicine

## 2014-01-02 ENCOUNTER — Encounter: Payer: Self-pay | Admitting: Family Medicine

## 2014-01-02 VITALS — BP 106/74 | HR 97 | Temp 99.9°F | Resp 16 | Ht 65.0 in | Wt 215.8 lb

## 2014-01-02 DIAGNOSIS — J029 Acute pharyngitis, unspecified: Secondary | ICD-10-CM

## 2014-01-02 DIAGNOSIS — R05 Cough: Secondary | ICD-10-CM

## 2014-01-02 DIAGNOSIS — R509 Fever, unspecified: Secondary | ICD-10-CM

## 2014-01-02 DIAGNOSIS — R635 Abnormal weight gain: Secondary | ICD-10-CM

## 2014-01-02 DIAGNOSIS — R059 Cough, unspecified: Secondary | ICD-10-CM

## 2014-01-02 LAB — POCT RAPID STREP A (OFFICE): Rapid Strep A Screen: NEGATIVE

## 2014-01-02 MED ORDER — BENZONATATE 100 MG PO CAPS: 200.0000 mg | ORAL_CAPSULE | Freq: Two times a day (BID) | ORAL | Status: DC | PRN

## 2014-01-02 MED ORDER — HYDROCODONE-HOMATROPINE 5-1.5 MG/5ML PO SYRP: 5.0000 mL | ORAL_SOLUTION | Freq: Every evening | ORAL | Status: DC | PRN

## 2014-01-02 MED ORDER — AMOXICILLIN 875 MG PO TABS: 875.0000 mg | ORAL_TABLET | Freq: Two times a day (BID) | ORAL | Status: DC

## 2014-01-02 NOTE — Patient Instructions (Signed)
Pharyngitis °Pharyngitis is redness, pain, and swelling (inflammation) of your pharynx.  °CAUSES  °Pharyngitis is usually caused by infection. Most of the time, these infections are from viruses (viral) and are part of a cold. However, sometimes pharyngitis is caused by bacteria (bacterial). Pharyngitis can also be caused by allergies. Viral pharyngitis may be spread from person to person by coughing, sneezing, and personal items or utensils (cups, forks, spoons, toothbrushes). Bacterial pharyngitis may be spread from person to person by more intimate contact, such as kissing.  °SIGNS AND SYMPTOMS  °Symptoms of pharyngitis include:   °· Sore throat.   °· Tiredness (fatigue).   °· Low-grade fever.   °· Headache. °· Joint pain and muscle aches. °· Skin rashes. °· Swollen lymph nodes. °· Plaque-like film on throat or tonsils (often seen with bacterial pharyngitis). °DIAGNOSIS  °Your health care provider will ask you questions about your illness and your symptoms. Your medical history, along with a physical exam, is often all that is needed to diagnose pharyngitis. Sometimes, a rapid strep test is done. Other lab tests may also be done, depending on the suspected cause.  °TREATMENT  °Viral pharyngitis will usually get better in 3 4 days without the use of medicine. Bacterial pharyngitis is treated with medicines that kill germs (antibiotics).  °HOME CARE INSTRUCTIONS  °· Drink enough water and fluids to keep your urine clear or pale yellow.   °· Only take over-the-counter or prescription medicines as directed by your health care provider:   °· If you are prescribed antibiotics, make sure you finish them even if you start to feel better.   °· Do not take aspirin.   °· Get lots of rest.   °· Gargle with 8 oz of salt water (½ tsp of salt per 1 qt of water) as often as every 1 2 hours to soothe your throat.   °· Throat lozenges (if you are not at risk for choking) or sprays may be used to soothe your throat. °SEEK MEDICAL  CARE IF:  °· You have large, tender lumps in your neck. °· You have a rash. °· You cough up green, yellow-brown, or bloody spit. °SEEK IMMEDIATE MEDICAL CARE IF:  °· Your neck becomes stiff. °· You drool or are unable to swallow liquids. °· You vomit or are unable to keep medicines or liquids down. °· You have severe pain that does not go away with the use of recommended medicines. °· You have trouble breathing (not caused by a stuffy nose). °MAKE SURE YOU:  °· Understand these instructions. °· Will watch your condition. °· Will get help right away if you are not doing well or get worse. °Document Released: 07/10/2005 Document Revised: 04/30/2013 Document Reviewed: 03/17/2013 °[employer]® Patient Information ©2014 [employer]. ° °

## 2014-01-02 NOTE — Progress Notes (Signed)
Chief Complaint:  Chief Complaint  Patient presents with  . Thyroid  . temperature fluctuating x 3 days T 99.-102 degrees    headache, sinus pressure, ears hurt    HPI: Tonya Ellis is a 23 y.o. female who is here for: 1. Sore throat, cough, fever tmax of 99.9 to 102, productive sinus dc, yellow/green with blood tinge x 3 days. [place]. Denies allergies or chronic sinusitis. She has taken advil sore throat and cough meds otc without releif. She was seen in 5/20 for sinusitis and given amoxacillin which helped. She denies allergies but husbnad who is here with here states that when she does take allergy meds it helps with her cough and overall sxs.   2. She also wants to be referred to endocrinology for further thyroid testing. Last time she was here in April for CPE she had normal TSH but did not tell Dr Patsy Lageropland that she was on otc thyroid supplement, she ahs been off of it for 1 month. She states that her weight has been fluctuating and she can't lose weight even when she changes her diet and exercise.   Past Medical History  Diagnosis Date  . H/O varicella   . Bipolar disorder   . Pyelonephritis during pregnancy, antepartum 2013  . Depression     bipolar, no meds- doing well  . HSV-2 infection    Past Surgical History  Procedure Laterality Date  . Wisdom tooth extraction     History   Social History  . Marital Status: Single    Spouse Name: N/A    Number of Children: N/A  . Years of Education: N/A   Social History Main Topics  . Smoking status: Never Smoker   . Smokeless tobacco: Never Used  . Alcohol Use: No  . Drug Use: No  . Sexual Activity: Yes    Partners: Male     Comment: nexplanon   Other Topics Concern  . None   Social History Narrative   Married   Civil engineer, contractingHigh school    Painting instructor   Light exercise   Family History  Problem Relation Age of Onset  . Anesthesia problems Neg Hx   . Other Neg Hx   . Mental illness Mother    . Hyperlipidemia Mother   . Drug abuse Father   . Cancer Maternal Grandmother     breast   . Mental illness Maternal Grandmother   . Diabetes Paternal Grandfather   . Mental illness Sister   . Mental illness Brother    No Known Allergies Prior to Admission medications   Medication Sig Start Date End Date Taking? Authorizing Provider  valACYclovir (VALTREX) 500 MG tablet Take 1 tablet bid po for 3 days during outbreak. Take 1 tablet po daily for suppression. 04/03/13  Yes Sherren MochaEva N Shaw, MD  amoxicillin (AMOXIL) 875 MG tablet Take 1 tablet (875 mg total) by mouth 2 (two) times daily. 12/10/13   Elvina SidleKurt Lauenstein, MD  Multiple Vitamins-Minerals (MULTIVITAMIN PO) Take 1 tablet by mouth daily.    Historical Provider, MD  norethindrone-ethinyl estradiol (JUNEL FE,GILDESS FE,LOESTRIN FE) 1-20 MG-MCG tablet Take 1 tablet by mouth daily. 09/24/12   Henreitta LeberElmira Powell, PA-C  OVER THE COUNTER MEDICATION Take 1 tablet by mouth daily. Patient takes cranberry pills,sometimes takes up to 3 a day    Historical Provider, MD  polyethylene glycol powder (GLYCOLAX/MIRALAX) powder Take 17 g by mouth 2 (two) times daily. 04/03/13   Carley HammedEva  Romie LeveeN Shaw, MD     ROS: The patient denies , night sweats, unintentional weight loss, chest pain, palpitations, wheezing, dyspnea on exertion, nausea, vomiting, abdominal pain, dysuria, hematuria, melena, numbness, weakness, or tingling.   All other systems have been reviewed and were otherwise negative with the exception of those mentioned in the HPI and as above.    PHYSICAL EXAM: Filed Vitals:   01/02/14 0944  BP: 106/74  Pulse: 97  Temp: 99.9 F (37.7 C)  Resp: 16   Filed Vitals:   01/02/14 0944  Height: 5\' 5"  (1.651 m)  Weight: 215 lb 12.8 oz (97.886 kg)   Body mass index is 35.91 kg/(m^2).  General: Alert, no acute distress HEENT:  Normocephalic, atraumatic, oropharynx patent. EOMI, PERRLA, TM normal, no sinus tenderness, + large tonsils , no exudates, boggy nares, no  thyroidmegaly Cardiovascular:  Regular rate and rhythm, no rubs murmurs or gallops.  No Carotid bruits, radial pulse intact. No pedal edema.  Respiratory: Clear to auscultation bilaterally.  No wheezes, rales, or rhonchi.  No cyanosis, no use of accessory musculature GI: No organomegaly, abdomen is soft and non-tender, positive bowel sounds.  No masses. Skin: No rashes. Neurologic: Facial musculature symmetric. Psychiatric: Patient is appropriate throughout our interaction. Lymphatic: No cervical lymphadenopathy Musculoskeletal: Gait intact.   LABS: Results for orders placed in visit on 01/02/14  POCT RAPID STREP A (OFFICE)      Result Value Ref Range   Rapid Strep A Screen Negative  Negative     EKG/XRAY:   Primary read [facility]   ASSESSMENT/PLAN: Encounter Diagnoses  Name Primary?  . Acute pharyngitis Yes  . Weight gain   . Fever, unspecified   . Cough    23 y/o female who is here with her husband.  Most likely viral illness or allergy related with minimal sinus sxs.  Since febrile at home then will rx amoxacillin Rx tessalon perles, otc nasacort and claritin  Advise that she may want to just try sxs treatment first and see if fever abates and sxs lessen. If so no abx needed.  She wants to see endocrinologist for FULL thyroid testing since she is worried she may have that problme wince not losing weight with active weigh loss plans. She was on thyroid supplement otc which helped stabilize weight and made her feel overall better but she ahs been off of it 1 month ago. She thinks the normal TSH may have been tainted by the thyroid supplement she was taking. I had asked her if she wants us to do thyroid panel here but she would like to see a specialist.   Gross sideeffects, risk and benefits, and alternatives of medications d/w patient. Patient is aware that all medications have potential sideeffects and we are unable to predict every sideeffect or drug-drug  interaction that may occur.  Tesslyn Baumert PHUONG, DO 01/02/2014 10:44 AM

## 2014-01-05 LAB — CULTURE, GROUP A STREP

## 2014-01-11 ENCOUNTER — Telehealth: Payer: Self-pay | Admitting: Family Medicine

## 2014-01-11 NOTE — Telephone Encounter (Signed)
LM about strep culture, not Group A strep but + for strep. She should be doesn with amox and feeling better. Referral to endocrinology at her request has already been made, hopefully she has that scheduled.

## 2014-01-16 ENCOUNTER — Ambulatory Visit (INDEPENDENT_AMBULATORY_CARE_PROVIDER_SITE_OTHER): Payer: No Typology Code available for payment source | Admitting: Physician Assistant

## 2014-01-16 VITALS — BP 110/70 | HR 84 | Temp 98.5°F | Resp 16 | Ht 65.0 in | Wt 220.0 lb

## 2014-01-16 DIAGNOSIS — N898 Other specified noninflammatory disorders of vagina: Secondary | ICD-10-CM

## 2014-01-16 DIAGNOSIS — R590 Localized enlarged lymph nodes: Secondary | ICD-10-CM

## 2014-01-16 DIAGNOSIS — R599 Enlarged lymph nodes, unspecified: Secondary | ICD-10-CM

## 2014-01-16 LAB — POCT WET PREP WITH KOH
KOH Prep POC: NEGATIVE
RBC Wet Prep HPF POC: NEGATIVE
TRICHOMONAS UA: NEGATIVE
Yeast Wet Prep HPF POC: NEGATIVE

## 2014-01-16 MED ORDER — DOXYCYCLINE HYCLATE 100 MG PO CAPS: 100.0000 mg | ORAL_CAPSULE | Freq: Two times a day (BID) | ORAL | Status: AC

## 2014-01-16 NOTE — Patient Instructions (Signed)
There is no evidence of a vaginal infection. The tender lump is likely a reactive lymph node responding to a "hair bump." You may find improved comfort with application of a warm compress to the area for 15-20 minutes 2-3 times each day, and OTC ibuprofen to reduce inflammation.
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
## 2014-01-28 ENCOUNTER — Encounter: Payer: Self-pay | Admitting: Internal Medicine

## 2014-01-28 ENCOUNTER — Ambulatory Visit (INDEPENDENT_AMBULATORY_CARE_PROVIDER_SITE_OTHER): Payer: No Typology Code available for payment source | Admitting: Internal Medicine

## 2014-01-28 VITALS — BP 110/60 | HR 80 | Temp 98.3°F | Resp 12 | Ht 64.5 in | Wt 219.6 lb

## 2014-01-28 DIAGNOSIS — R635 Abnormal weight gain: Secondary | ICD-10-CM

## 2014-01-28 DIAGNOSIS — N926 Irregular menstruation, unspecified: Secondary | ICD-10-CM

## 2014-01-28 LAB — T3, FREE: T3, Free: 3.2 pg/mL (ref 2.3–4.2)

## 2014-01-28 LAB — LUTEINIZING HORMONE: LH: 6.67 m[IU]/mL

## 2014-01-28 LAB — FOLLICLE STIMULATING HORMONE: FSH: 3.8 m[IU]/mL

## 2014-01-28 LAB — TSH: TSH: 0.47 u[IU]/mL (ref 0.35–4.50)

## 2014-01-28 LAB — T4, FREE: FREE T4: 0.81 ng/dL (ref 0.60–1.60)

## 2014-01-28 LAB — HEMOGLOBIN A1C: HEMOGLOBIN A1C: 5.1 % (ref 4.6–6.5)

## 2014-01-28 NOTE — Progress Notes (Signed)
Patient ID: Tonya Ellis, female   DOB: 01-12-1991, 23 y.o.   MRN: [id number]   HPI  Tonya Ellis is a 23 y.o.-year-old female, referred by her PCP, Dr. Hamilton Caprihao Le, in consultation for endocrine causes for weight gain.  Pt. started to gain weight when she was a teenager. She tells me needs to do large amounts of exercise (hours) every day to lose weight. Largest amount she lost: 30 lbs when she was 23 y/o. No exercise now other than walking.  Pt also describes: - + sluggish - + constipation - + fatigued - no cold intolerance, + heat intolerance - + increased sleep latency but then sleeps a long time (12-16 hours) - + constipation - + dry skin; + acne on face - no hair falling - improved depression, + mild anxiety - + irregular menses (she was on OCP >> no cycles), off OCP every other mo or less often, she has at least 4x a year. No ovarian cysts. LMP 01/02/2014. She had 1 child.   Meals: - Breakfast: cereals (cheerios or rice crispies) + almond milk - Lunch: sandwich with light mayo, tomato, lunch meat/salad/sushi - Dinner: vegetarian, or lean meats - Snacks: 0 , no soft drinks, mild sweet tea.  I reviewed pt's thyroid tests: Lab Results  Component Value Date   TSH 1.430 11/10/2013   TSH 2.057 10/24/2007   FREET4 1.11 10/24/2007   At the time of the last TFT draw, she was on a thyroid OTC supplement (ThyroSense - ctns iodine, L-tyrosine, and ashwaganda) >> felt better, more energy, weight stable. She stopped the supplement 2 mo ago.   She does not have a family thyroid ds. She has a FH of obesity.   I reviewed her chart and she also has a history of depression, h/o suicide attempt.  ROS: Constitutional: see HPI Eyes: no blurry vision, no xerophthalmia ENT: no sore throat, no nodules palpated in throat, no dysphagia/odynophagia, no hoarseness Cardiovascular: no CP/SOB/+ palpitations/no leg swelling Respiratory: no cough/SOB Gastrointestinal: no N/V/+D/+C Musculoskeletal: +  muscle/no joint aches Skin: no rashes Neurological: no tremors/numbness/tingling/dizziness, + HA Psychiatric: + both: depression/anxiety  Past Medical History  Diagnosis Date  . H/O varicella   . Pyelonephritis during pregnancy, antepartum 2013  . Depression     ?bipolar, no meds- doing well  . HSV-2 infection    Past Surgical History  Procedure Laterality Date  . Wisdom tooth extraction     History   Social History  . Marital Status: Single    Spouse Name: Glenda ChromanBrennan Streater    Number of Children: 1  . Years of Education: 12   Occupational History  . Art Instructor    Social History Main Topics  . Smoking status: Never Smoker   . Smokeless tobacco: Never Used  . Alcohol Use: Yes     Comment: very rarely  . Drug Use: No  . Sexual Activity: Yes    Partners: Male    Birth Control/ Protection: Condom   Social History Narrative   Lives with her husband and two housemates.  Her daughter (01/2012) lives with adoptive parents in an open adoption arrangement. Light exercise.   Current Outpatient Prescriptions on File Prior to Visit  Medication Sig Dispense Refill  . valACYclovir (VALTREX) 500 MG tablet Take 1 tablet bid po for 3 days during outbreak. Take 1 tablet po daily for suppression.  90 tablet  4   No current facility-administered medications on file prior to visit.   No Known Allergies Family  History  Problem Relation Age of Onset  . Anesthesia problems Neg Hx   . Other Neg Hx   . Mental illness Mother     bipolar disorder  . Hyperlipidemia Mother   . Fibromyalgia Mother   . Obesity Mother   . Drug abuse Father     reportedly heroin addict  . Cancer Maternal Grandmother     breast   . Mental illness Maternal Grandmother   . Diabetes Paternal Grandfather   . Mental illness Sister     depression  . Obesity Brother    PE: BP 110/60  Pulse 80  Temp(Src) 98.3 F (36.8 C) (Oral)  Resp 12  Ht 5' 4.5" (1.638 m)  Wt 219 lb 9.6 oz (99.61 kg)  BMI 37.13 kg/m2   SpO2 98%  LMP 01/02/2014 Wt Readings from Last 3 Encounters:  01/28/14 219 lb 9.6 oz (99.61 kg)  01/16/14 220 lb (99.791 kg)  01/02/14 215 lb 12.8 oz (97.886 kg)   Constitutional: obese, in NAD. Appears depressed. No full  fat pads. Eyes: PERRLA, EOMI, no exophthalmos ENT: moist mucous membranes, no thyromegaly, no cervical lymphadenopathy Cardiovascular: RRR, No MRG Respiratory: CTA B Gastrointestinal: abdomen soft, NT, ND, BS+ Musculoskeletal: no deformities, strength intact in all 4 Skin: moist, warm, no rashes, multiple tattoos, + acne on face; no hirsutism Neurological: no tremor with outstretched hands, DTR normal in all 4  ASSESSMENT: 1. Obesity - class II - see below: BMI Classification:  < 18.5 underweight   18.5-24.9 normal weight   25.0-29.9 overweight   30.0-34.9 class I obesity   35.0-39.9 class II obesity   ? 40.0 class III obesity eight gain  2. Irregular menses  PLAN: 1. Obesity  - Discussed diet in detail, reviewing each of her food choices and advised for alternatives (also given a list of healthy substitutions, the patient instructions section). Given specific examples about healthy choices. - Advised to continue staying off drinking sodas of any kind. Substitute with fresh fruit-flavored water. - Discussed at length about the benefits of a diet with less meat, with probably the best being a vegan one. Given instructions about materials to use for the plant-based diet (please see patient instructions) - Discussed about exercise and importance of getting any level of activity during the day, even walking (increase to power walking 3 miles a day in 45 min). - we discussed about endocrine causes for obesity, to include:   hypothyroidism (will check again now off thyroid supplement x 2 mo)  menopause (will check LH, FSH, estrogen)  PCOS (will check labs as she has acne and irregular menses along with weight gain)  Cushing disease (unlikely 2/2: her  weight gain is generalized, not truncal; no full supraclavicular fat pads; no sudden weight gain; no wide stretch marks; no moon facies or facial plethora) - since she is obese and has a FH of DM, will check a HbA1c - I will see the patient back in 4 months for recheck, but I encouraged her to join MyChart and let the know about her progress with the diet or any questions that she might have.   Orders Placed This Encounter  Procedures  . HgB A1c  . Testosterone, free, total  . LH  . FSH  . Estradiol  . Androstenedione  . TSH  . T4, free  . T3, free  . Thyroid Peroxidase Antibody  . 17-Hydroxyprogesterone  . Prolactin    Component     Latest Ref Rng 01/28/2014  Testosterone  10 - 70 ng/dL 161 (H)  Sex Hormone Binding     18 - 114 nmol/L 48  Testosterone Free     0.6 - 6.8 pg/mL 16.2 (H)  Testosterone-% Free     0.4 - 2.4 % 1.5  Estradiol, Free      2.79  Estradiol      141  TSH     0.35 - 4.50 uIU/mL 0.47  Hemoglobin A1C     4.6 - 6.5 % 5.1  LH      6.67  FSH      3.8  Androstenedione      201  Free T4     0.60 - 1.60 ng/dL 0.96  T3, Free     2.3 - 4.2 pg/mL 3.2  Thyroid Peroxidase Antibody     <35.0 IU/mL <10.0  17-OH-Progesterone, LC/MS/MS      189  Prolactin      7.0   Pt likely in luteal phase, which is not optimum for hormone check but the testosterone clearly high. Lab results point towards PCOS. No thyroid dysfxn. No hyperprolactinemia. No prediabetes or diabetes.  We can try Metformin or OCPs. Will need to bring her back to discuss.

## 2014-01-28 NOTE — Patient Instructions (Signed)
Please stop at the lab. We will check labs for prediabetes/diabetes, polycystic ovarian syndrome, and thyroid disease.  Please try to join MyChart for easier communication. Please come back for a follow-up appointment in 4 months  Please consider the following ways to cut down carbs and fat and increase fiber and micronutrients in your diet:  - substitute whole grain for white bread or pasta - substitute brown rice for white rice - substitute 90-calorie flat bread pieces for slices of bread when possible - substitute sweet potatoes or yams for white potatoes - substitute humus for margarine - substitute tofu for cheese when possible - substitute almond or rice milk for regular milk (would not drink soy milk daily due to concern for soy estrogen influence on breast cancer risk) - substitute dark chocolate for other sweets when possible - substitute water - can add lemon or orange slices for taste - for diet sodas (artificial sweeteners will trick your body that you can eat sweets without getting calories and will lead you to overeating and weight gain in the long run) - do not skip breakfast or other meals (this will slow down the metabolism and will result in more weight gain over time)  - can try smoothies made from fruit and almond/rice milk in am instead of regular breakfast - can also try old-fashioned (not instant) oatmeal made with almond/rice milk in am - order the dressing on the side when eating salad at a restaurant (pour less than half of the dressing on the salad) - eat as little meat as possible - can try juicing, but should not forget that juicing will get rid of the fiber, so would alternate with eating raw veg./fruits or drinking smoothies - use as little oil as possible, even when using olive oil - can dress a salad with a mix of balsamic vinegar and lemon juice, for e.g. - use agave nectar, stevia sugar, or regular sugar rather than artificial sweateners - steam or  broil/roast veggies  - snack on veggies/fruit/nuts (unsalted, preferably) when possible, rather than processed foods - reduce or eliminate aspartame in diet (it is in diet sodas, chewing gum, etc) Read the labels!  Try to read Dr. Katherina RightNeal Barnard's book: "Program for Reversing Diabetes" for the vegan concept and other ideas for healthy eating.  Plant-based diet materials: - Lectures (you tube):  Lequita AsalNeal Barnard: "Breaking the Food Seduction"  Doug Lisle: "How to Lose Weight, without Losing Your Mind"  Lucile CraterRodney Snow: "What is Insulin Resistance" [place]sonEntrepreneur.sihttp://www.youtube.com/watch?v=k5iM6A67z3Y - Documentaries:  Supersize Me  [employer]  Forks over BorgWarnerKnives  Vegucated  Fat, Sick and Nearly Dead  The Edison InternationalWeight of the Nationwide Mutual Insuranceation - Books:  Lequita AsalNeal Barnard: "Program for Reversing Diabetes"  Ferol LuzColin Campbell: "The Armeniahina Study"  Konrad PentaDonna Klein: "Supermarket Vegan" (cookbook) - Facebook pages:   Reece AgarForks versus Knives  Vegucated  Toys ''R'' UsVegNews Magazine  Food Matters - Healthy nutrition info websites:  LateTelevision.com.eehttp://nutritionfacts.org/

## 2014-01-29 LAB — TESTOSTERONE, FREE, TOTAL, SHBG
SEX HORMONE BINDING: 48 nmol/L (ref 18–114)
TESTOSTERONE FREE: 16.2 pg/mL — AB (ref 0.6–6.8)
TESTOSTERONE-% FREE: 1.5 % (ref 0.4–2.4)
Testosterone: 112 ng/dL — ABNORMAL HIGH (ref 10–70)

## 2014-01-29 LAB — PROLACTIN: PROLACTIN: 7 ng/mL

## 2014-01-29 LAB — THYROID PEROXIDASE ANTIBODY

## 2014-02-01 LAB — ANDROSTENEDIONE: Androstenedione: 201 ng/dL

## 2014-02-01 LAB — 17-HYDROXYPROGESTERONE: 17-OH-PROGESTERONE, LC/MS/MS: 189 ng/dL

## 2014-02-04 LAB — ESTRADIOL, FREE
ESTRADIOL FREE: 2.79 pg/mL
Estradiol: 141 pg/mL

## 2014-02-17 ENCOUNTER — Encounter: Payer: Self-pay | Admitting: Internal Medicine

## 2014-02-17 ENCOUNTER — Ambulatory Visit (INDEPENDENT_AMBULATORY_CARE_PROVIDER_SITE_OTHER): Payer: No Typology Code available for payment source | Admitting: Internal Medicine

## 2014-02-17 VITALS — BP 118/70 | HR 67 | Temp 98.3°F | Wt 215.0 lb

## 2014-02-17 DIAGNOSIS — E282 Polycystic ovarian syndrome: Secondary | ICD-10-CM

## 2014-02-17 HISTORY — DX: Polycystic ovarian syndrome: E28.2

## 2014-02-17 MED ORDER — METFORMIN HCL 500 MG PO TABS: 1000.0000 mg | ORAL_TABLET | Freq: Two times a day (BID) | ORAL | Status: AC

## 2014-02-17 NOTE — Progress Notes (Signed)
Patient ID: Tonya Ellis, female   DOB: 09-23-1990, 23 y.o.   MRN: [id number]   HPI  Tonya Ellis is a 23 y.o.-year-old female, initially referred by her PCP, Dr. Hamilton Caprihao Le, for endocrine causes for weight gain, now with a new dx of PCOS established based on clinical presentation and labs obtained at last visit. She is here with her family for counseling about her new dx.  Reviewed hx: Pt. started to gain weight when she was a teenager. She tells me needs to do large amounts of exercise (hours) every day to lose weight. Largest amount she lost: 30 lbs when she was 23 y/o. No exercise now other than walking.  Pt also described at last visit, and most of these sxs are still present: - + sluggish - + constipation - + fatigued - no cold intolerance, + heat intolerance - + increased sleep latency but then sleeps a long time (12-16 hours) - + constipation - + dry skin; + acne on face - saw Derm - advised for soap and lotions - no hair falling - improved depression, + mild anxiety - + irregular menses (she was on OCP >> no cycles), off OCP every other mo or less often, she has at least 4x a year. No ovarian cysts. LMP 01/02/2014. She had 1 child.  Tried LoLoestrin, microgestin, DepoProvera, Nexplanon.   At last visit, as she had irregular menses, acne and weight gain, we checked labs for PCOS and these came back with a high testosterone:  Component     Latest Ref Rng 01/28/2014  Testosterone     10 - 70 ng/dL 409112 (H)  Sex Hormone Binding     18 - 114 nmol/L 48  Testosterone Free     0.6 - 6.8 pg/mL 16.2 (H)  Testosterone-% Free     0.4 - 2.4 % 1.5  Estradiol, Free      2.79  Estradiol      141  TSH     0.35 - 4.50 uIU/mL 0.47  Hemoglobin A1C     4.6 - 6.5 % 5.1  LH      6.67  FSH      3.8  Androstenedione      201  Free T4     0.60 - 1.60 ng/dL 8.110.81  T3, Free     2.3 - 4.2 pg/mL 3.2  Thyroid Peroxidase Antibody     <35.0 IU/mL <10.0  17-OH-Progesterone, LC/MS/MS      189   Prolactin      7.0  Lab results point towards PCOS. No thyroid dysfxn. No hyperprolactinemia. No prediabetes or diabetes.  I reviewed pt's thyroid tests and they have been normal: Lab Results  Component Value Date   TSH 0.47 01/28/2014   TSH 1.430 11/10/2013   TSH 2.057  10/24/2007   FREET4 0.81 01/28/2014   FREET4 1.11 10/24/2007   At the time of the 10/2013 TFT draw, she was on a thyroid OTC supplement (ThyroSense - ctns iodine, L-tyrosine, and ashwaganda) >> now off for few months.  Pt has a history of depression, h/o suicide attempt.  ROS: Constitutional: see HPI Eyes: no blurry vision, no xerophthalmia ENT: no sore throat, no nodules palpated in throat, no dysphagia/odynophagia, no hoarseness Cardiovascular: + CP/no SOB/palpitations/no leg swelling Respiratory: no cough/SOB Gastrointestinal: no N/V/+D/+C Musculoskeletal: + muscle/no joint aches Skin: no rashes Neurological: no tremors/numbness/tingling/dizziness, + HA + Low libido  PE: BP 118/70  Pulse 67  Temp(Src) 98.3 F (36.8 C) (Oral)  Wt 215 lb (97.523 kg)  SpO2 97% Wt Readings from Last 3 Encounters:  02/17/14 215 lb (97.523 kg)  01/28/14 219 lb 9.6 oz (99.61 kg)  01/16/14 220 lb (99.791 kg)   Constitutional: obese, in NAD.  Eyes: PERRLA, EOMI, no exophthalmos ENT: moist mucous membranes, no thyromegaly, no cervical lymphadenopathy Cardiovascular: RRR, No MRG Respiratory: CTA B Gastrointestinal: abdomen soft, NT, ND, BS+ Musculoskeletal: no deformities, strength intact in all 4 Skin: moist, warm, no rashes, multiple tattoos, + acne on face; no hirsutism Neurological: no tremor with outstretched hands, DTR normal in all 4  ASSESSMENT: 1. PCOS - new dx  PLAN: 1.  I had a long discussion with the patient and her family about the fact that the PCOS is a misnomer, a patient does not necessarily have to have polycystic ovaries to be diagnosed with the disorder. This is of sum of several conditions,  including:  weight gain  insulin resistance (and therefore a higher risk of developing diabetes later in life)  acne  hirsutism  irregular menstrual cycles  decreased fertility. - We also discussed about the fact that the treatment is usually targeted to addressing the problem that concerns the patient the most: acne/hirsutism, weight gain, or fertility, but there is no single treatment for PCOS.  She is mostly bothered by her weight, then her acne. - The first-line therapy are oral contraceptives. If she is concerned with her weight, we can use metformin; if she is concerned about acne/hirsutism, we can add spironolactone; and if she is concerned about fertility, I could refer her to reproductive endocrinology for possible use of clomiphene. She has tried several contraceptives in the past: oral, injectable and implants but she did nt tolerated them well. She would like to forego OCPs for now and try Metformin first. - I advised her to start metformin: Patient Instructions  Please start Metformin 500 mg with dinner x 5 days. If you tolerate this well, add another Metformin tablet (500 mg) with breakfast x 5 days. If you tolerate this well, add another metformin tablet with dinner (total 1000 mg) x 5 days. If you tolerate this well, add another metformin tablet with breakfast (total 1000 mg). Continue with 1000 mg of metformin twice a day with breakfast and dinner. Please let me know if you like me to refer you to nutrition. Please come back for a follow-up appointment in 6 months - I also advised her to reestablish care with her ObGyn dr for regular PAP exams and to see if there is a need for an ovarian U/S - discussed diet again >> she started to lose weight by trying to eat a more plant-based diet but she is not vegan or vegetarian - offered to refer her to nutrition - she will think about it and let me know - Return in about 6 months (around 08/20/2014).

## 2014-02-17 NOTE — Patient Instructions (Addendum)
Please start Metformin 500 mg with dinner x 5 days. If you tolerate this well, add another Metformin tablet (500 mg) with breakfast x 5 days. If you tolerate this well, add another metformin tablet with dinner (total 1000 mg) x 5 days. If you tolerate this well, add another metformin tablet with breakfast (total 1000 mg). Continue with 1000 mg of metformin twice a day with breakfast and dinner. Please let me know if you like me to refer you to nutrition. Please come back for a follow-up appointment in 6 months  Polycystic Ovarian Syndrome Polycystic ovarian syndrome (PCOS) is a common hormonal disorder among women of reproductive age. Most women with PCOS grow many small cysts on their ovaries. PCOS can cause problems with your periods and make it difficult to get pregnant. It can also cause an increased risk of miscarriage with pregnancy. If left untreated, PCOS can lead to serious health problems, such as diabetes and heart disease. CAUSES The cause of PCOS is not fully understood, but genetics may be a factor. SIGNS AND SYMPTOMS   Infrequent or no menstrual periods.   Inability to get pregnant (infertility) because of not ovulating.   Increased growth of hair on the face, chest, stomach, back, thumbs, thighs, or toes.   Acne, oily skin, or dandruff.   Pelvic pain.   Weight gain or obesity, usually carrying extra weight around the waist.   Type 2 diabetes.   High cholesterol.   High blood pressure.   Female-pattern baldness or thinning hair.   Patches of thickened and dark brown or black skin on the neck, arms, breasts, or thighs.   Tiny excess flaps of skin (skin tags) in the armpits or neck area.   Excessive snoring and having breathing stop at times while asleep (sleep apnea).   Deepening of the voice.   Gestational diabetes when pregnant.  DIAGNOSIS  There is no single test to diagnose PCOS.   Your health care provider will:   Take a medical history.    Perform a pelvic exam.   Have ultrasonography done.   Check your female and female hormone levels.   Measure glucose or sugar levels in the blood.   Do other blood tests.   If you are producing too many female hormones, your health care provider will make sure it is from PCOS. At the physical exam, your health care provider will want to evaluate the areas of increased hair growth. Try to allow natural hair growth for a few days before the visit.   During a pelvic exam, the ovaries may be enlarged or swollen because of the increased number of small cysts. This can be seen more easily by using vaginal ultrasonography or screening to examine the ovaries and lining of the uterus (endometrium) for cysts. The uterine lining may become thicker if you have not been having a regular period.  TREATMENT  Because there is no cure for PCOS, it needs to be managed to prevent problems. Treatments are based on your symptoms. Treatment is also based on whether you want to have a baby or whether you need contraception.  Treatment may include:   Progesterone hormone to start a menstrual period.   Birth control pills to make you have regular menstrual periods.   Medicines to make you ovulate, if you want to get pregnant.   Medicines to control your insulin.   Medicine to control your blood pressure.   Medicine and diet to control your high cholesterol and triglycerides in your  blood.  Medicine to reduce excessive hair growth.  Surgery, making small holes in the ovary, to decrease the amount of female hormone production. This is done through a long, lighted tube (laparoscope) placed into the pelvis through a tiny incision in the lower abdomen.  HOME CARE INSTRUCTIONS  Only take over-the-counter or prescription medicine as directed by your health care provider.  Pay attention to the foods you eat and your activity levels. This can help reduce the effects of PCOS.  Keep your weight under  control.  Eat foods that are low in carbohydrate and high in fiber.  Exercise regularly. SEEK MEDICAL CARE IF:  Your symptoms do not get better with medicine.  You have new symptoms. Document Released: 11/03/2004 Document Revised: 04/30/2013 Document Reviewed: 12/26/2012 [facility] Patient Information 2015 [employer] [place]. This information is not intended to replace advice given to you by your health care provider. Make sure you discuss any questions you have with your health care provider.

## 2014-02-23 ENCOUNTER — Encounter: Payer: Self-pay | Admitting: Internal Medicine

## 2014-03-26 ENCOUNTER — Encounter (HOSPITAL_COMMUNITY): Payer: Self-pay | Admitting: Obstetrics and Gynecology

## 2014-04-12 IMAGING — US US OB TRANSVAGINAL
1 series · 12 of 28 positions shown · non-contrast
Comparison: none

[Series 1: us ob comp +14 wk · 12 of 64 slices shown]
[im 3/64]
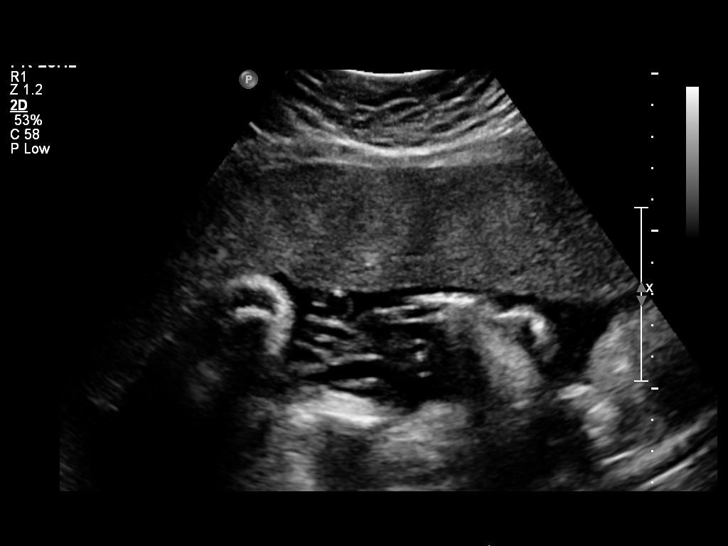
[im 8/64]
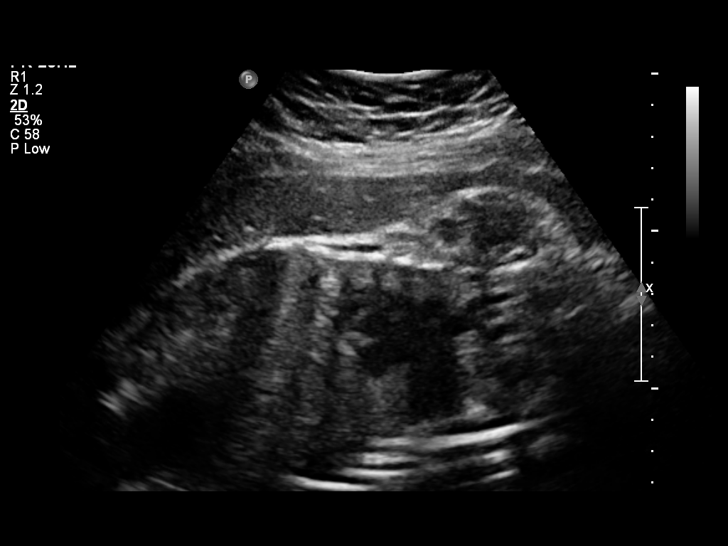
[im 12/64]
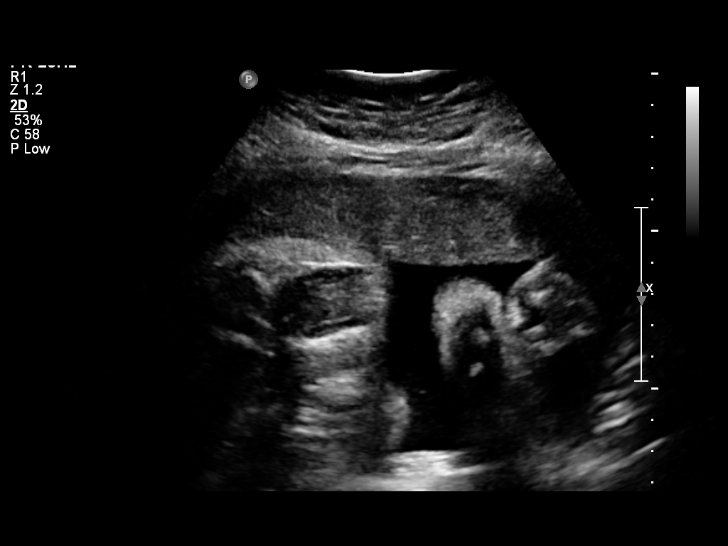
[im 19/64]
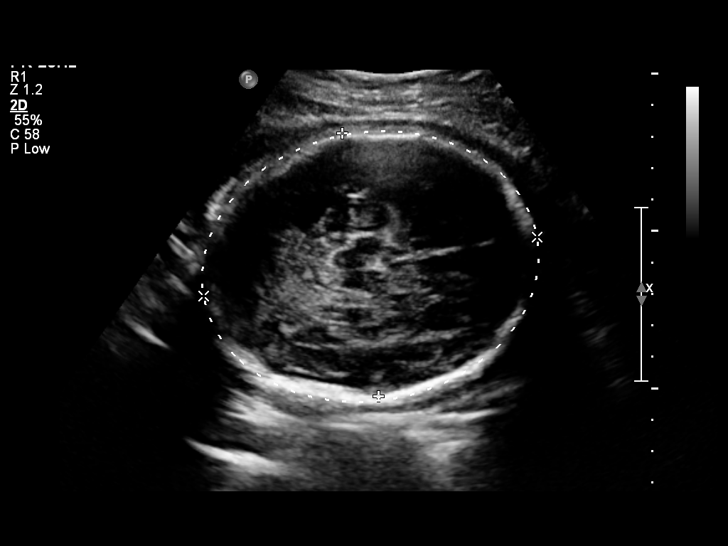
[im 24/64]
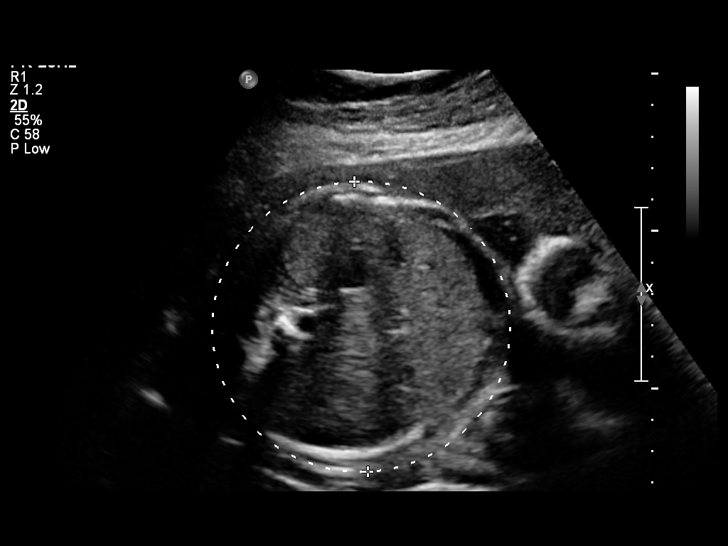
[im 29/64]
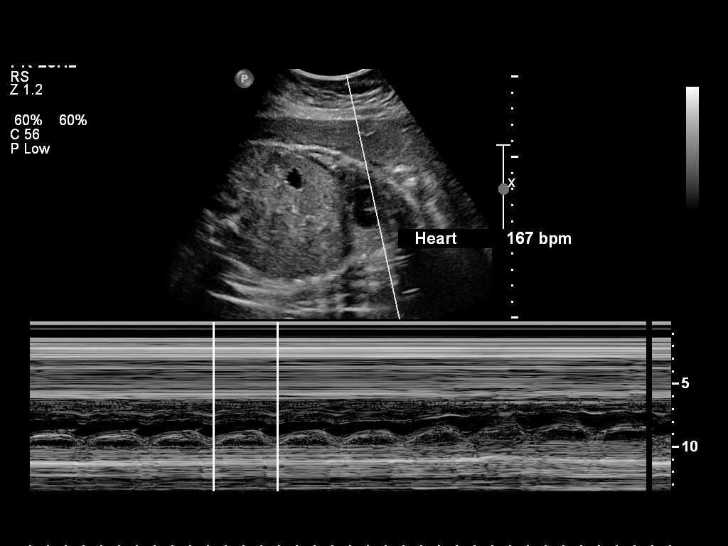
[im 36/64]
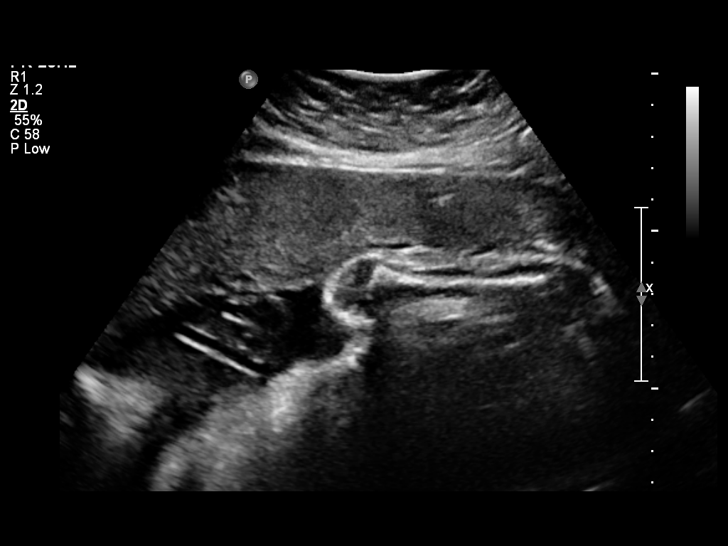
[im 40/64]
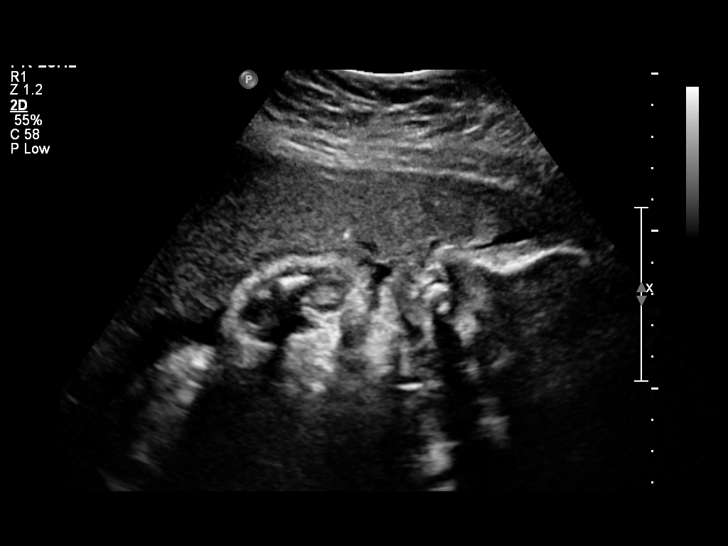
[im 45/64]
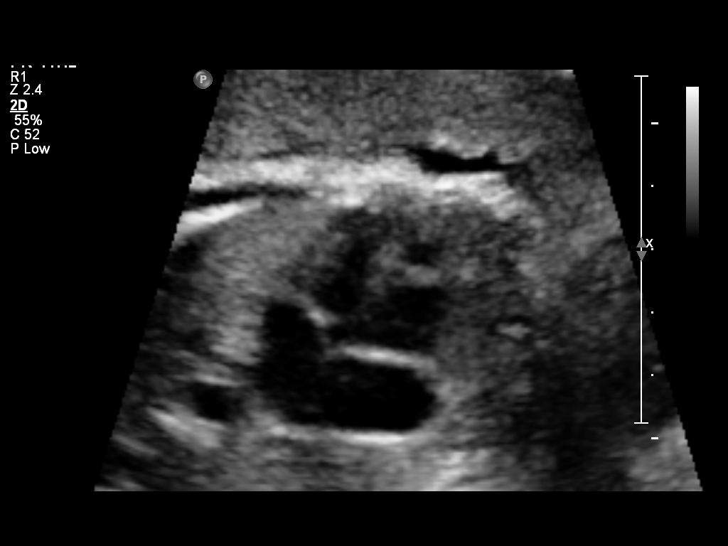
[im 52/64]
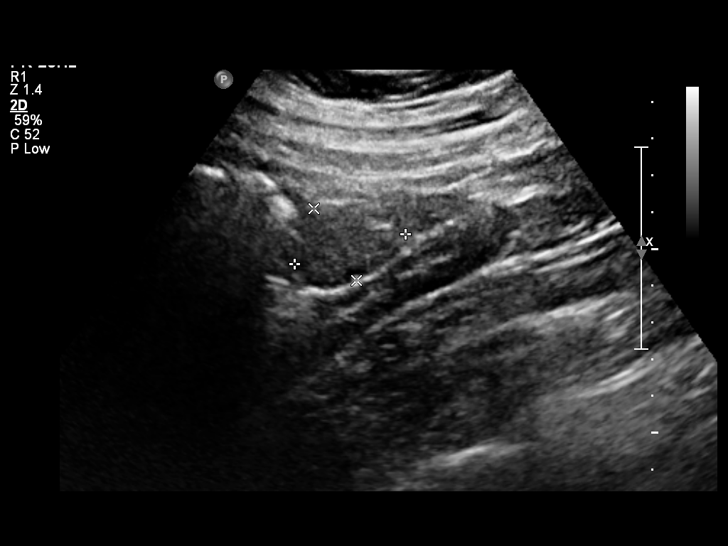
[im 57/64]
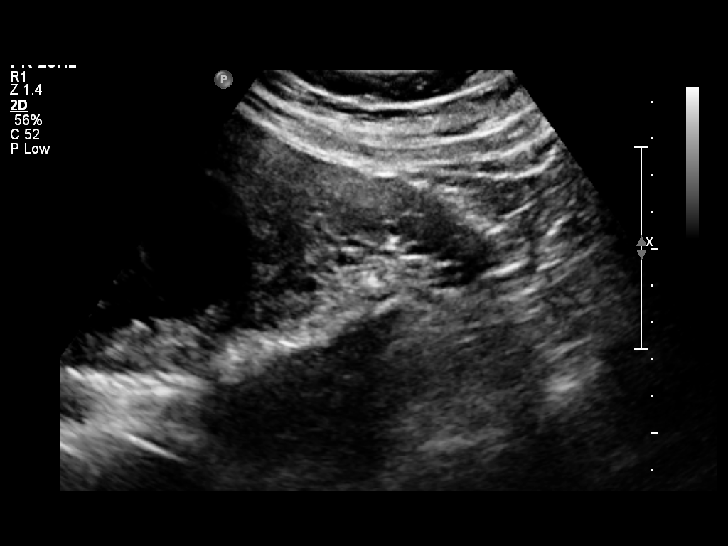
[im 61/64]
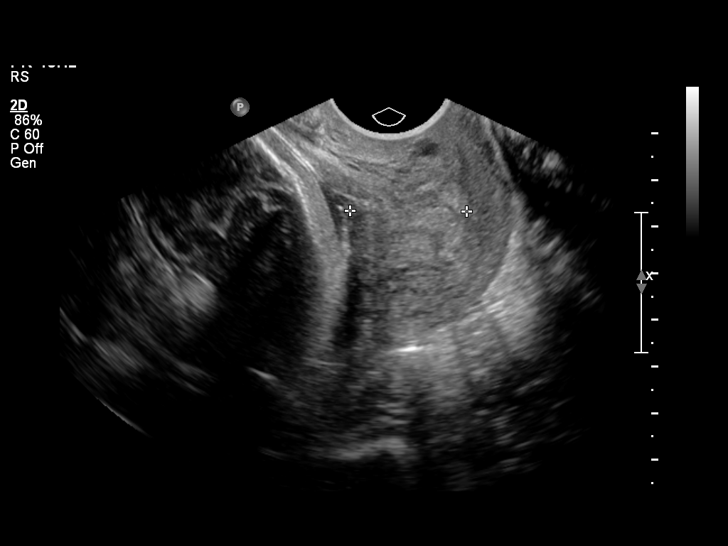

[12 of 28 positions shown; findings below may reference images not displayed]

OBSTETRICS REPORT
                      (Signed Final 12/22/2011 [DATE])

                 32_E
Procedures

 US OB COMP + 14 WK                                    76805.1
 US OB TRANSVAGINAL                                    76817.0
Indications

 Vaginal bleeding, unknown etiology
 Assess placenta
 Assess cervical length
 Assess Fetal Growth / Estimated Fetal Weight
Fetal Evaluation

 Fetal Heart Rate:  167                         bpm
 Cardiac Activity:  Observed
 Presentation:      Cephalic
 Placenta:          Anterior, above cervical os

 Amniotic Fluid
 AFI FV:      Subjectively within normal limits
 AFI Sum:     12.33   cm      36   %Tile     Larg Pckt:     5.2  cm
 RLQ:   5.2    cm    LUQ:    3.69   cm    LLQ:   3.44    cm
Biometry

 BPD:     83.1  mm    G. Age:   33w 3d                CI:        73.28   70 - 86
                                                      FL/HC:      21.3   19.9 -

 HC:     308.5  mm    G. Age:   34w 3d       39  %    HC/AC:      1.09   0.96 -

 AC:     283.5  mm    G. Age:   32w 2d       23  %    FL/BPD:     79.2   71 - 87
 FL:      65.8  mm    G. Age:   33w 6d       52  %    FL/AC:      23.2   20 - 24

 Est. FW:    4548  gm    4 lb 11 oz      52  %
Gestational Age

 LMP:           39w 5d       Date:   03/19/11                 EDD:   12/24/11
 Clinical EDD:  33w 3d                                        EDD:   02/06/12
 U/S Today:     33w 4d                                        EDD:   02/05/12
 Best:          33w 3d    Det. By:   Clinical EDD             EDD:   02/06/12
Anatomy

 Cranium:           Appears normal      Aortic Arch:       Not well
                                                           visualized
 Fetal Cavum:       Appears normal      Ductal Arch:       Not well
                                                           visualized
 Ventricles:        Appears normal      Diaphragm:         Appears normal
 Choroid Plexus:    Not well            Stomach:           Appears normal
                    visualized
 Cerebellum:        Not well            Abdomen:           Appears normal
                    visualized
 Posterior Fossa:   Appears normal      Abdominal Wall:    Not well
                                                           visualized
 Nuchal Fold:       Not applicable      Cord Vessels:      Appears normal
                    (>20 wks GA)                           (3 vessel cord)
 Face:              Appears normal      Kidneys:           Appear normal
                    (/orbits)
 Heart:             Appears normal      Bladder:           Appears normal
                    (4 chamber &
                    axis)
 RVOT:              Not well            Spine:             Not well
                    visualized                             visualized
 LVOT:              Not well            Limbs:             Four extremities
                    visualized                             seen

 Other:     Technically difficult due to advanced GA and fetal
            position.
Cervix Uterus Adnexa

 Cervical Length:   2.9       cm

 Cervix:       Normal appearance by transvaginal scan

 Left Ovary:   Within normal limits.
 Right Ovary:  Within normal limits.
Impression

 Single living IUP with assigned GA of 33w 3d. EGA by
 ultrasound is concordant with dating by LMP. EFW is at the
 52nd percentile.
 Placenta is anterior and above the cervical os. No evidence
 of placental abruption.
 Fetal anatomic evaluation limited by advanced GA and fetal
 position. No fetal anatomic abnormality identified.
 Normal amniotic fluid volume.
 Cervical length is 2.9 cm, measured transvaginally.

 questions or concerns.

## 2014-05-18 ENCOUNTER — Other Ambulatory Visit: Payer: Self-pay | Admitting: Family Medicine

## 2014-05-25 ENCOUNTER — Encounter (HOSPITAL_COMMUNITY): Payer: Self-pay | Admitting: Obstetrics and Gynecology

## 2014-10-02 ENCOUNTER — Ambulatory Visit (INDEPENDENT_AMBULATORY_CARE_PROVIDER_SITE_OTHER): Payer: No Typology Code available for payment source | Admitting: Internal Medicine

## 2014-10-02 VITALS — BP 126/74 | HR 100 | Temp 98.0°F | Resp 17 | Ht 65.0 in | Wt 220.0 lb

## 2014-10-02 DIAGNOSIS — J209 Acute bronchitis, unspecified: Secondary | ICD-10-CM | POA: Diagnosis not present

## 2014-10-02 DIAGNOSIS — R071 Chest pain on breathing: Secondary | ICD-10-CM

## 2014-10-02 MED ORDER — AZITHROMYCIN 500 MG PO TABS: 500.0000 mg | ORAL_TABLET | Freq: Every day | ORAL | Status: DC

## 2014-10-02 NOTE — Progress Notes (Signed)
   Subjective:    Patient ID: Tonya Ellis, female    DOB: [birth year], 24 y.o.   MRN: [id number]  HPI  My name is Tonya Ellis, I am scribing for Dr. Perrin MalteseGuest Patient presents today with low grade fever at 99.8, sinusitis, headache, postnasal drainage, productive cough with yellow/brown sputum. Chest congestion, she is having a hard time taking a deep breath. No diarrhea, no vomiting No sob, no smoke. Edited by dr. Perrin Malteseguest   Review of Systems     Objective:   Physical Exam  Constitutional: She is oriented to person, place, and time. She appears well-developed and well-nourished. No distress.  HENT:  Head: Normocephalic.  Right Ear: External ear normal.  Left Ear: External ear normal.  Nose: Mucosal edema, rhinorrhea and sinus tenderness present. Right sinus exhibits maxillary sinus tenderness. Left sinus exhibits maxillary sinus tenderness.  Mouth/Throat: Uvula is midline. No uvula swelling. Posterior oropharyngeal erythema present.  Eyes: Conjunctivae and EOM are normal. Pupils are equal, round, and reactive to light.  Neck: Normal range of motion. Neck supple.  Cardiovascular: Normal rate, regular rhythm and normal heart sounds.   Pulmonary/Chest: Effort normal. No tachypnea. No respiratory distress. She has no wheezes. She has rhonchi. She has no rales. She exhibits tenderness.  Lymphadenopathy:    She has cervical adenopathy.  Neurological: She is alert and oriented to person, place, and time. She exhibits normal muscle tone.  Psychiatric: She has a normal mood and affect.  Vitals reviewed.         Assessment & Plan:  Bronchitis Zithromax 500mg  5d

## 2014-10-02 NOTE — Patient Instructions (Signed)
Fever, Adult °A fever is a higher than normal body temperature. In an adult, an oral temperature around 98.6° F (37° C) is considered normal. A temperature of 100.4° F (38° C) or higher is generally considered a fever. Mild or moderate fevers generally have no long-term effects and often do not require treatment. Extreme fever (greater than or equal to 106° F or 41.1° C) can cause seizures. The sweating that may occur with repeated or prolonged fever may cause dehydration. Elderly people can develop confusion during a fever. °A measured temperature can vary with: °· Age. °· Time of day. °· Method of measurement (mouth, underarm, rectal, or ear). °The fever is confirmed by taking a temperature with a thermometer. Temperatures can be taken different ways. Some methods are accurate and some are not. °· An oral temperature is used most commonly. Electronic thermometers are fast and accurate. °· An ear temperature will only be accurate if the thermometer is positioned as recommended by the manufacturer. °· A rectal temperature is accurate and done for those adults who have a condition where an oral temperature cannot be taken. °· An underarm (axillary) temperature is not accurate and not recommended. °Fever is a symptom, not a disease.  °CAUSES  °· Infections commonly cause fever. °· Some noninfectious causes for fever include: °· Some arthritis conditions. °· Some thyroid or adrenal gland conditions. °· Some immune system conditions. °· Some types of cancer. °· A medicine reaction. °· High doses of certain street drugs such as methamphetamine. °· Dehydration. °· Exposure to high outside or room temperatures. °· Occasionally, the source of a fever cannot be determined. This is sometimes called a "fever of unknown origin" (FUO). °· Some situations may lead to a temporary rise in body temperature that may go away on its own. Examples are: °· Childbirth. °· Surgery. °· Intense exercise. °HOME CARE INSTRUCTIONS  °· Take  appropriate medicines for fever. Follow dosing instructions carefully. If you use acetaminophen to reduce the fever, be careful to avoid taking other medicines that also contain acetaminophen. Do not take aspirin for a fever if you are younger than age 19. There is an association with Reye's syndrome. Reye's syndrome is a rare but potentially deadly disease. °· If an infection is present and antibiotics have been prescribed, take them as directed. Finish them even if you start to feel better. °· Rest as needed. °· Maintain an adequate fluid intake. To prevent dehydration during an illness with prolonged or recurrent fever, you may need to drink extra fluid. Drink enough fluids to keep your urine clear or pale yellow. °· Sponging or bathing with room temperature water may help reduce body temperature. Do not use ice water or alcohol sponge baths. °· Dress comfortably, but do not over-bundle. °SEEK MEDICAL CARE IF:  °· You are unable to keep fluids down. °· You develop vomiting or diarrhea. °· You are not feeling at least partly better after 3 days. °· You develop new symptoms or problems. °SEEK IMMEDIATE MEDICAL CARE IF:  °· You have shortness of breath or trouble breathing. °· You develop excessive weakness. °· You are dizzy or you faint. °· You are extremely thirsty or you are making little or no urine. °· You develop new pain that was not there before (such as in the head, neck, chest, back, or abdomen). °· You have persistent vomiting and diarrhea for more than 1 to 2 days. °· You develop a stiff neck or your eyes become sensitive to light. °· You develop a   skin rash. °· You have a fever or persistent symptoms for more than 2 to 3 days. °· You have a fever and your symptoms suddenly get worse. °MAKE SURE YOU:  °· Understand these instructions. °· Will watch your condition. °· Will get help right away if you are not doing well or get worse. °Document Released: 01/03/2001 Document Revised: 11/24/2013 Document  Reviewed: 05/11/2011 °ExitCare® Patient Information ©2015 [employer]. This information is not intended to replace advice given to you by your health care provider. Make sure you discuss any questions you have with your health care provider. ° °Cough, Adult ° A cough is a reflex that helps clear your throat and airways. It can help heal the body or may be a reaction to an irritated airway. A cough may only last 2 or 3 weeks (acute) or may last more than 8 weeks (chronic).  °CAUSES °Acute cough: °· Viral or bacterial infections. °Chronic cough: °· Infections. °· Allergies. °· Asthma. °· Post-nasal drip. °· Smoking. °· Heartburn or acid reflux. °· Some medicines. °· Chronic lung problems (COPD). °· Cancer. °SYMPTOMS  °· Cough. °· Fever. °· Chest pain. °· Increased breathing rate. °· High-pitched whistling sound when breathing (wheezing). °· Colored mucus that you cough up (sputum). °TREATMENT  °· A bacterial cough may be treated with antibiotic medicine. °· A viral cough must run its course and will not respond to antibiotics. °· Your caregiver may recommend other treatments if you have a chronic cough. °HOME CARE INSTRUCTIONS  °· Only take over-the-counter or prescription medicines for pain, discomfort, or fever as directed by your caregiver. Use cough suppressants only as directed by your caregiver. °· Use a cold steam vaporizer or humidifier in your bedroom or home to help loosen secretions. °· Sleep in a semi-upright position if your cough is worse at night. °· Rest as needed. °· Stop smoking if you smoke. °SEEK IMMEDIATE MEDICAL CARE IF:  °· You have pus in your sputum. °· Your cough starts to worsen. °· You cannot control your cough with suppressants and are losing sleep. °· You begin coughing up blood. °· You have difficulty breathing. °· You develop pain which is getting worse or is uncontrolled with medicine. °· You have a fever. °MAKE SURE YOU:  °· Understand these instructions. °· Will watch your  condition. °· Will get help right away if you are not doing well or get worse. °Document Released: 01/06/2011 Document Revised: 10/02/2011 Document Reviewed: 01/06/2011 °ExitCare® Patient Information ©2015 [employer]. This information is not intended to replace advice given to you by your health care provider. Make sure you discuss any questions you have with your health care provider. ° °

## 2016-02-19 ENCOUNTER — Ambulatory Visit (INDEPENDENT_AMBULATORY_CARE_PROVIDER_SITE_OTHER): Payer: 59 | Admitting: Physician Assistant

## 2016-02-19 VITALS — BP 124/72 | HR 84 | Temp 99.1°F | Resp 18 | Ht 65.0 in | Wt 195.0 lb

## 2016-02-19 DIAGNOSIS — Z1329 Encounter for screening for other suspected endocrine disorder: Secondary | ICD-10-CM

## 2016-02-19 DIAGNOSIS — Z13228 Encounter for screening for other metabolic disorders: Secondary | ICD-10-CM

## 2016-02-19 DIAGNOSIS — Z8659 Personal history of other mental and behavioral disorders: Secondary | ICD-10-CM

## 2016-02-19 DIAGNOSIS — Z130 Encounter for screening for diseases of the blood and blood-forming organs and certain disorders involving the immune mechanism: Secondary | ICD-10-CM

## 2016-02-19 DIAGNOSIS — Z131 Encounter for screening for diabetes mellitus: Secondary | ICD-10-CM | POA: Diagnosis not present

## 2016-02-19 DIAGNOSIS — Z87898 Personal history of other specified conditions: Secondary | ICD-10-CM | POA: Insufficient documentation

## 2016-02-19 DIAGNOSIS — Z0000 Encounter for general adult medical examination without abnormal findings: Secondary | ICD-10-CM

## 2016-02-19 DIAGNOSIS — Z13220 Encounter for screening for lipoid disorders: Secondary | ICD-10-CM | POA: Diagnosis not present

## 2016-02-19 NOTE — Patient Instructions (Signed)
     IF you received an x-ray today, you will receive an invoice from [facility]. Please contact  Radiology at [phone] with questions or concerns regarding your invoice.   IF you received labwork today, you will receive an invoice from [facility]. Please contact [facility] at [phone] with questions or concerns regarding your invoice.   Our billing staff will not be able to assist you with questions regarding bills from these companies.  You will be contacted with the lab results as soon as they are available. The fastest way to get your results is to activate your My Chart account. Instructions are located on the last page of this paperwork. If you have not heard from us regarding the results in 2 weeks, please contact this office.      
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
## 2016-02-22 LAB — COMPREHENSIVE METABOLIC PANEL
ALK PHOS: 50 IU/L (ref 39–117)
ALT: 54 IU/L — AB (ref 0–32)
AST: 30 IU/L (ref 0–40)
Albumin/Globulin Ratio: 1.3 (ref 1.2–2.2)
Albumin: 3.8 g/dL (ref 3.5–5.5)
BUN/Creatinine Ratio: 11 (ref 9–23)
BUN: 7 mg/dL (ref 6–20)
Bilirubin Total: <0.2 mg/dL (ref 0.0–1.2)
CALCIUM: 9.9 mg/dL (ref 8.7–10.2)
CO2: 24 mmol/L (ref 18–29)
CREATININE: 0.61 mg/dL (ref 0.57–1.00)
Chloride: 100 mmol/L (ref 96–106)
GFR calc Af Amer: 147 mL/min/{1.73_m2} (ref >59)
GFR, EST NON AFRICAN AMERICAN: 127 mL/min/{1.73_m2} (ref >59)
GLUCOSE: 81 mg/dL (ref 65–99)
Globulin, Total: 3 g/dL (ref 1.5–4.5)
Potassium: 4.5 mmol/L (ref 3.5–5.2)
SODIUM: 138 mmol/L (ref 134–144)
Total Protein: 6.8 g/dL (ref 6.0–8.5)

## 2016-02-22 LAB — CBC WITH DIFFERENTIAL/PLATELET
BASOS ABS: 0 10*3/uL (ref 0.0–0.2)
Basos: 0 %
EOS (ABSOLUTE): 0.2 10*3/uL (ref 0.0–0.4)
Eos: 1 %
HEMOGLOBIN: 12.4 g/dL (ref 11.1–15.9)
Hematocrit: 36.6 % (ref 34.0–46.6)
IMMATURE GRANS (ABS): 0 10*3/uL (ref 0.0–0.1)
Immature Granulocytes: 0 %
Lymphocytes Absolute: 3 10*3/uL (ref 0.7–3.1)
Lymphs: 26 %
MCH: 28.4 pg (ref 26.6–33.0)
MCHC: 33.9 g/dL (ref 31.5–35.7)
MCV: 84 fL (ref 79–97)
MONOCYTES: 8 %
Monocytes Absolute: 0.9 10*3/uL (ref 0.1–0.9)
NEUTROS ABS: 7.4 10*3/uL — AB (ref 1.4–7.0)
Neutrophils: 65 %
Platelets: 461 10*3/uL — ABNORMAL HIGH (ref 150–379)
RBC: 4.37 x10E6/uL (ref 3.77–5.28)
RDW: 14.2 % (ref 12.3–15.4)
WBC: 11.5 10*3/uL — ABNORMAL HIGH (ref 3.4–10.8)

## 2016-02-22 LAB — HEMOGLOBIN A1C
ESTIMATED AVERAGE GLUCOSE: 94 mg/dL
Hgb A1c MFr Bld: 4.9 % (ref 4.8–5.6)

## 2016-02-22 LAB — LIPID PANEL
CHOLESTEROL TOTAL: 225 mg/dL — AB (ref 100–199)
Chol/HDL Ratio: 3.2 ratio units (ref 0.0–4.4)
HDL: 71 mg/dL (ref >39)
LDL CALC: 120 mg/dL — AB (ref 0–99)
Triglycerides: 170 mg/dL — ABNORMAL HIGH (ref 0–149)
VLDL CHOLESTEROL CAL: 34 mg/dL (ref 5–40)

## 2016-02-22 LAB — TSH: TSH: 0.816 u[IU]/mL (ref 0.450–4.500)

## 2016-05-09 ENCOUNTER — Encounter: Payer: Self-pay | Admitting: Family Medicine

## 2016-05-09 ENCOUNTER — Ambulatory Visit (INDEPENDENT_AMBULATORY_CARE_PROVIDER_SITE_OTHER): Payer: 59 | Admitting: Family Medicine

## 2016-05-09 VITALS — BP 110/76 | HR 91 | Temp 98.5°F | Resp 16 | Ht 64.5 in | Wt 195.0 lb

## 2016-05-09 DIAGNOSIS — Z23 Encounter for immunization: Secondary | ICD-10-CM | POA: Diagnosis not present

## 2016-05-09 DIAGNOSIS — J45990 Exercise induced bronchospasm: Secondary | ICD-10-CM | POA: Diagnosis not present

## 2016-05-09 MED ORDER — ALBUTEROL SULFATE HFA 108 (90 BASE) MCG/ACT IN AERS: 2.0000 | INHALATION_SPRAY | Freq: Four times a day (QID) | RESPIRATORY_TRACT | 0 refills | Status: DC | PRN

## 2016-05-09 NOTE — Patient Instructions (Addendum)
Bronchospasm, Adult A bronchospasm is a spasm or tightening of the airways going into the lungs. During a bronchospasm breathing becomes more difficult because the airways get smaller. When this happens there can be coughing, a whistling sound when breathing (wheezing), and difficulty breathing. Bronchospasm is often associated with asthma, but not all patients who experience a bronchospasm have asthma. CAUSES  A bronchospasm is caused by inflammation or irritation of the airways. The inflammation or irritation may be triggered by:   Allergies (such as to animals, pollen, food, or mold). Allergens that cause bronchospasm may cause wheezing immediately after exposure or many hours later.   Infection. Viral infections are believed to be the most common cause of bronchospasm.   Exercise.   Irritants (such as pollution, cigarette smoke, strong odors, aerosol sprays, and paint fumes).   Weather changes. Winds increase molds and pollens in the air. Rain refreshes the air by washing irritants out. Cold air may cause inflammation.   Stress and emotional upset.  SIGNS AND SYMPTOMS   Wheezing.   Excessive nighttime coughing.   Frequent or severe coughing with a simple cold.   Chest tightness.   Shortness of breath.  DIAGNOSIS  Bronchospasm is usually diagnosed through a history and physical exam. Tests, such as chest X-rays, are sometimes done to look for other conditions. TREATMENT   Inhaled medicines can be given to open up your airways and help you breathe. The medicines can be given using either an inhaler or a nebulizer machine.  Corticosteroid medicines may be given for severe bronchospasm, usually when it is associated with asthma. HOME CARE INSTRUCTIONS   Always have a plan prepared for seeking medical care. Know when to call your health care provider and local emergency services (911 in the [place] Know where you can access local emergency care.  Only take medicines as  directed by your health care provider.  If you were prescribed an inhaler or nebulizer machine, ask your health care provider to explain how to use it correctly. Always use a spacer with your inhaler if you were given one.  It is necessary to remain calm during an attack. Try to relax and breathe more slowly.  Control your home environment in the following ways:   Change your heating and air conditioning filter at least once a month.   Limit your use of fireplaces and wood stoves.  Do not smoke and do not allow smoking in your home.   Avoid exposure to perfumes and fragrances.   Get rid of pests (such as roaches and mice) and their droppings.   Throw away plants if you see mold on them.   Keep your house clean and dust free.   Replace carpet with wood, tile, or vinyl flooring. Carpet can trap dander and dust.   Use allergy-proof pillows, mattress covers, and box spring covers.   Wash bed sheets and blankets every week in hot water and dry them in a dryer.   Use blankets that are made of polyester or cotton.   Wash hands frequently. SEEK MEDICAL CARE IF:   You have muscle aches.   You have chest pain.   The sputum changes from clear or white to yellow, green, gray, or bloody.   The sputum you cough up gets thicker.   There are problems that may be related to the medicine you are given, such as a rash, itching, swelling, or trouble breathing.  SEEK IMMEDIATE MEDICAL CARE IF:   You have worsening wheezing and coughing   even after taking your prescribed medicines.   You have increased difficulty breathing.   You develop severe chest pain. MAKE SURE YOU:   Understand these instructions.  Will watch your condition.  Will get help right away if you are not doing well or get worse.   This information is not intended to replace advice given to you by your health care provider. Make sure you discuss any questions you have with your health care  provider.   Document Released: 07/13/2003 Document Revised: 07/31/2014 Document Reviewed: 12/30/2012 Elsevier Interactive Patient Education 2016 [employer]. Exercise-Induced Bronchospasm A bronchospasm is a condition that is commonly caused by exercise, in which the muscles around the bronchioles (airways to the lungs) tighten, causing the airway to constrict. Exercise-induced bronchospasms are usually associated with short periods of vigorous activity. Many people who experience an exercise-induced bronchospasm may not notice at the time of the event; however, the athlete may later experience symptoms that negatively affect training and performance. SYMPTOMS   High-pitched sounds with breathing (wheezing).  Coughing.  Increased work of breathing (dyspnea).  Rapid breathing (hyperventilation).  Chest pain.  Symptoms occurring 4 to 6 hours after exercise is completed (late-phase reaction). CAUSES  It is not known why certain individuals experience bronchospasms. Respiratory specialists currently think that the cool or dry air breathed in may cause damage to the lining of the bronchioles, which elicits an inflammatory response. The inflammation causes the airways to narrow and symptoms then occur. RISK INCREASES WITH:  Viral infections.  Exercise in cold air.  Exercise in dry conditions.  Poor physical fitness.  High-intensity exercise.  No warm-up before play.  Frequent exposure to substances that produce allergic reactions (allergens). PREVENTION   Improve conditioning.  Treat allergies.  Breathe warm air (cover mouth and nose with a towel or scarf).  Warm up for an appropriate period of time before physical activity.  Gradually decrease intensity (warm down) for an appropriate period of time after physical activity. PROGNOSIS  Most people with exercise-induced asthma respond well to medication. Patients are typically prescribed an inhaler to treat bronchospasms.  However, if symptoms persist despite treatment and continue to affect performance, individuals may need to consider avoiding activities that produce symptoms. RELATED COMPLICATIONS   Decreased athletic performance.  Inability to condition as well as expected.  Side effects from medications. TREATMENT   Maintain physical fitness.  Run with a scarf or towel over your mouth in cold, dry air.  Complete at least 10 minutes of warm-up before high-intensity exercise.  Warm down after play.  Treat allergies. MEDICATION  The usual initial medication is an albuterol inhaler, which expands the constricted bronchioles.  The second-line medication is inhaled corticosteroids, which reduce inflammation in the airway.  Alternative medications included sodium cromoglycate and nedocromil inhalers.  Long-acting medications such as salmeterol can also be used as second-line medications. ACTIVITY  If medications are able to treat the offending symptoms, then no activity modification is required. If you know you will be training or competing in cold or dry climates take extra precautions to prevent symptoms. DIET  No specific diet is recommended.  SEEK MEDICAL CARE IF:   Greater than normal fatigue with exercise.  Greater than normal difficulty breathing occurs with exercise.  Increased wheezing with exercise.  You appear to be breathing harder and faster than expected with training.  Allergies appear to be uncontrollable.  You experience chest pain with exercise.   This information is not intended to replace advice given to you by  your health care provider. Make sure you discuss any questions you have with your health care provider.   Document Released: 07/10/2005 Document Revised: 07/31/2014 Document Reviewed: 10/22/2008 Elsevier Interactive Patient Education [employer]2016 Elsevier Inc.

## 2016-05-09 NOTE — Progress Notes (Signed)
Chief Complaint  Patient presents with  . Asthma attack    x 3 days   . Immunizations    flu    HPI  She reports that she has been told that she may have asthma On Friday and Saturday she was having wheezing, dry cough, shortness of breathalbut The patient reports wheezing and cough with exertion She reports that she has symptoms like this as a teenager. She denies history of respiratory disease in childhood No family history of asthma Non smoker No nighttime symptoms She has a history of panic attacks but these episodes are different.  Past Medical History:  Diagnosis Date  . Depression    ?bipolar, no meds- doing well  . H/O varicella   . HSV-2 infection   . PCOS (polycystic ovarian syndrome) 02/17/2014  . Pyelonephritis during pregnancy, antepartum 2013    Current Outpatient Prescriptions  Medication Sig Dispense Refill  . drospirenone-ethinyl estradiol (YAZ,GIANVI,LORYNA) 3-0.02 MG tablet Take 1 tablet by mouth daily.    Marland Kitchen. loratadine (CLARITIN) 10 MG tablet Take 10 mg by mouth daily.    . metFORMIN (GLUCOPHAGE) 500 MG tablet Take 2 tablets (1,000 mg total) by mouth 2 (two) times daily with a meal. 120 tablet 5  . valACYclovir (VALTREX) 500 MG tablet Take 1 tablet by mouth twice daily for 3 days for outbreak; take 1 tablet every day for suppression 90 tablet 0  . albuterol (PROVENTIL HFA;VENTOLIN HFA) 108 (90 Base) MCG/ACT inhaler Inhale 2 puffs into the lungs every 6 (six) hours as needed for wheezing or shortness of breath. 1 Inhaler 0   No current facility-administered medications for this visit.     Allergies: No Known Allergies  Past Surgical History:  Procedure Laterality Date  . WISDOM TOOTH EXTRACTION      Social History   Social History  . Marital status: Married    Spouse name: Tonya Ellis  . Number of children: 1  . 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 Smoking status: Never Smoker    . Smokeless tobacco: Never Used  . Alcohol use Yes     Comment: very rarely  . Drug use: No  . Sexual activity: Yes    Partners: Male    Birth control/ protection: Pill     Comment: lifetime 5039, female and female   Other Topics Concern  . None   Social History Narrative   Lives with her husband (she is trying to get separated but cannot move out as of 01/2016) and two housemates/renters.     Her daughter (01/2012) lives with adoptive parents in an open adoption arrangement.    Light exercise.    ROS  Objective: Vitals:   05/09/16 1648  BP: 110/76  Pulse: 91  Resp: 16  Temp: 98.5 F (36.9 C)  TempSrc: Oral  SpO2: 97%  Weight: 195 lb (88.5 kg)  Height: 5' 4.5" (1.638 m)    Physical Exam  General: alert, oriented, in NAD Head: normocephalic, atraumatic, no sinus tenderness Eyes: EOM intact, no scleral icterus or conjunctival injection Ears: TM clear bilaterally Throat: no pharyngeal exudate or erythema Lymph: no posterior auricular, submental or cervical lymph adenopathy Heart: normal rate, normal sinus rhythm, no murmurs Lungs: clear to auscultation bilaterally, no wheezing   Assessment and Plan Tonya Ellis was seen today for asthma attack and immunizations.  Diagnoses and all orders for this visit:  Exercise-induced bronchospasm- will give a trial of  albuterol Pt declined referral to allergy or pulmonology due to cost Discussed that she should return if symptoms progress  Need for prophylactic vaccination and inoculation against influenza -     Flu Vaccine QUAD 36+ mos IM  Other orders -     albuterol (PROVENTIL HFA;VENTOLIN HFA) 108 (90 Base) MCG/ACT inhaler; Inhale 2 puffs into the lungs every 6 (six) hours as needed for wheezing or shortness of breath.     Tonya Ellis A Tonya Ellis

## 2016-08-07 ENCOUNTER — Ambulatory Visit (INDEPENDENT_AMBULATORY_CARE_PROVIDER_SITE_OTHER): Payer: 59 | Admitting: Physician Assistant

## 2016-08-07 VITALS — BP 122/70 | HR 95 | Temp 99.4°F | Resp 18 | Ht 64.5 in | Wt 196.0 lb

## 2016-08-07 DIAGNOSIS — J0100 Acute maxillary sinusitis, unspecified: Secondary | ICD-10-CM

## 2016-08-07 MED ORDER — IPRATROPIUM BROMIDE 0.03 % NA SOLN: 2.0000 | Freq: Two times a day (BID) | NASAL | 0 refills | Status: DC

## 2016-08-07 MED ORDER — AMOXICILLIN-POT CLAVULANATE 875-125 MG PO TABS: 1.0000 | ORAL_TABLET | Freq: Two times a day (BID) | ORAL | 0 refills | Status: DC
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
## 2016-08-07 NOTE — Patient Instructions (Addendum)
     IF you received an x-ray today, you will receive an invoice from [facility]. Please contact [facility] at [phone] with questions or concerns regarding your invoice.   IF you received labwork today, you will receive an invoice from [facility]. Please contact [facility] at [phone] with questions or concerns regarding your invoice.   Our billing staff will not be able to assist you with questions regarding bills from these companies.  You will be contacted with the lab results as soon as they are available. The fastest way to get your results is to activate your My Chart account. Instructions are located on the last page of this paperwork. If you have not heard from us regarding the results in 2 weeks, please contact this office.      Sinusitis, Adult Sinusitis is soreness and inflammation of your sinuses. Sinuses are hollow spaces in the bones around your face. They are located:  Around your eyes.  In the middle of your forehead.  Behind your nose.  In your cheekbones. Your sinuses and nasal passages are lined with a stringy fluid (mucus). Mucus normally drains out of your sinuses. When your nasal tissues get inflamed or swollen, the mucus can get trapped or blocked so air cannot flow through your sinuses. This lets bacteria, viruses, and funguses grow, and that leads to infection. Follow these instructions at home: Medicines   Take, use, or apply over-the-counter and prescription medicines only as told by your doctor. These may include nasal sprays.  If you were prescribed an antibiotic medicine, take it as told by your doctor. Do not stop taking the antibiotic even if you start to feel better. Hydrate and Humidify   Drink enough water to keep your pee (urine) clear or pale yellow.  Use a cool mist humidifier to keep the humidity level in your home above 50%.  Breathe in steam for 10-15 minutes, 3-4 times a day or as told by your doctor. You can do  this in the bathroom while a hot shower is running.  Try not to spend time in cool or dry air. Rest   Rest as much as possible.  Sleep with your head raised (elevated).  Make sure to get enough sleep each night. General instructions   Put a warm, moist washcloth on your face 3-4 times a day or as told by your doctor. This will help with discomfort.  Wash your hands often with soap and water. If there is no soap and water, use hand sanitizer.  Do not smoke. Avoid being around people who are smoking (secondhand smoke).  Keep all follow-up visits as told by your doctor. This is important. Contact a doctor if:  You have a fever.  Your symptoms get worse.  Your symptoms do not get better within 10 days. Get help right away if:  You have a very bad headache.  You cannot stop throwing up (vomiting).  You have pain or swelling around your face or eyes.  You have trouble seeing.  You feel confused.  Your neck is stiff.  You have trouble breathing. This information is not intended to replace advice given to you by your health care provider. Make sure you discuss any questions you have with your health care provider. Document Released: 12/27/2007 Document Revised: 03/05/2016 Document Reviewed: 05/05/2015 Elsevier Interactive Patient Education  2017 Elsevier Inc.  

## 2016-11-08 ENCOUNTER — Ambulatory Visit (INDEPENDENT_AMBULATORY_CARE_PROVIDER_SITE_OTHER): Payer: 59 | Admitting: Physician Assistant

## 2016-11-08 VITALS — BP 97/67 | HR 99 | Temp 98.0°F | Resp 16 | Ht 64.5 in | Wt 194.8 lb

## 2016-11-08 DIAGNOSIS — D72829 Elevated white blood cell count, unspecified: Secondary | ICD-10-CM | POA: Diagnosis not present

## 2016-11-08 DIAGNOSIS — N390 Urinary tract infection, site not specified: Secondary | ICD-10-CM

## 2016-11-08 DIAGNOSIS — R319 Hematuria, unspecified: Secondary | ICD-10-CM

## 2016-11-08 DIAGNOSIS — R300 Dysuria: Secondary | ICD-10-CM | POA: Diagnosis not present

## 2016-11-08 LAB — POCT URINALYSIS DIP (MANUAL ENTRY)
Bilirubin, UA: NEGATIVE
GLUCOSE UA: NEGATIVE mg/dL
NITRITE UA: NEGATIVE
PH UA: 5.5 (ref 5.0–8.0)
Protein Ur, POC: 30 mg/dL — AB
Spec Grav, UA: 1.025 (ref 1.010–1.025)
UROBILINOGEN UA: 0.2 U/dL

## 2016-11-08 LAB — POCT CBC
Granulocyte percent: 67.2 %G (ref 37–80)
HCT, POC: 36.8 % — AB (ref 37.7–47.9)
Hemoglobin: 12.9 g/dL (ref 12.2–16.2)
Lymph, poc: 3.9 — AB (ref 0.6–3.4)
MCH, POC: 29.3 pg (ref 27–31.2)
MCHC: 35 g/dL (ref 31.8–35.4)
MCV: 83.6 fL (ref 80–97)
MID (cbc): 0.4 (ref 0–0.9)
MPV: 6.9 fL (ref 0–99.8)
POC Granulocyte: 8.7 — AB (ref 2–6.9)
POC LYMPH PERCENT: 30 %L (ref 10–50)
POC MID %: 2.8 %M (ref 0–12)
Platelet Count, POC: 446 10*3/uL — AB (ref 142–424)
RBC: 4.4 M/uL (ref 4.04–5.48)
RDW, POC: 12.4 %
WBC: 13 10*3/uL — AB (ref 4.6–10.2)

## 2016-11-08 LAB — POC MICROSCOPIC URINALYSIS (UMFC): Mucus: ABSENT

## 2016-11-08 MED ORDER — CIPROFLOXACIN HCL 500 MG PO TABS: 500.0000 mg | ORAL_TABLET | Freq: Two times a day (BID) | ORAL | 0 refills | Status: AC

## 2016-11-08 MED ORDER — CEFTRIAXONE SODIUM 1 G IJ SOLR
1.0000 g | Freq: Once | INTRAMUSCULAR | Status: AC
  Administered 2016-11-08: 1 g via INTRAMUSCULAR

## 2016-11-08 NOTE — Patient Instructions (Addendum)
Please stay well hydrated - drink at least 1-2 liters of water/day.  Take ENTIRE COURSE of your antibiotics, even if you start to feel better.  Come back or go to the ED if you are not better in 3-5 days.   Thank you for coming in today. I hope you feel we met your needs.  Feel free to call [facility] if you have any questions or further requests.  Please consider signing up for MyChart if you do not already have it, as this is a great way to communicate with me.  Best,  Whitney McVey, PA-C  IF you received an x-ray today, you will receive an invoice from [facility]. Please contact [facility] at [phone] with questions or concerns regarding your invoice.   IF you received labwork today, you will receive an invoice from [facility]. Please contact [facility] at [phone] with questions or concerns regarding your invoice.   Our billing staff will not be able to assist you with questions regarding bills from these companies.  You will be contacted with the lab results as soon as they are available. The fastest way to get your results is to activate your My Chart account. Instructions are located on the last page of this paperwork. If you have not heard from [place] regarding the results in 2 weeks, please contact this office.
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
## 2016-11-10 LAB — URINE CULTURE

## 2018-02-25 ENCOUNTER — Encounter: Payer: Self-pay | Admitting: Emergency Medicine

## 2018-02-25 ENCOUNTER — Other Ambulatory Visit: Payer: Self-pay

## 2018-02-25 ENCOUNTER — Ambulatory Visit (INDEPENDENT_AMBULATORY_CARE_PROVIDER_SITE_OTHER): Payer: PRIVATE HEALTH INSURANCE | Admitting: Emergency Medicine

## 2018-02-25 VITALS — BP 112/75 | HR 78 | Temp 98.5°F | Resp 16 | Wt 190.6 lb

## 2018-02-25 DIAGNOSIS — J029 Acute pharyngitis, unspecified: Secondary | ICD-10-CM | POA: Diagnosis not present

## 2018-02-25 LAB — POCT RAPID STREP A (OFFICE): RAPID STREP A SCREEN: NEGATIVE

## 2018-02-25 MED ORDER — AZITHROMYCIN 250 MG PO TABS: ORAL_TABLET | ORAL | 0 refills | Status: DC
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
## 2018-02-25 NOTE — Patient Instructions (Addendum)
     IF you received an x-ray today, you will receive an invoice from [facility]. Please contact [facility] at [phone] with questions or concerns regarding your invoice.   IF you received labwork today, you will receive an invoice from [facility]. Please contact [facility] at [phone] with questions or concerns regarding your invoice.   Our billing staff will not be able to assist you with questions regarding bills from these companies.  You will be contacted with the lab results as soon as they are available. The fastest way to get your results is to activate your My Chart account. Instructions are located on the last page of this paperwork. If you have not heard from us regarding the results in 2 weeks, please contact this office.     Sore Throat When you have a sore throat, your throat may:  Hurt.  Burn.  Feel irritated.  Feel scratchy.  Many things can cause a sore throat, including:  An infection.  Allergies.  Dryness in the air.  Smoke or pollution.  Gastroesophageal reflux disease (GERD).  A tumor.  A sore throat can be the first sign of another sickness. It can happen with other problems, like coughing or a fever. Most sore throats go away without treatment. Follow these instructions at home:  Take over-the-counter medicines only as told by your doctor.  Drink enough fluids to keep your pee (urine) clear or pale yellow.  Rest when you feel you need to.  To help with pain, try: ? Sipping warm liquids, such as broth, herbal tea, or warm water. ? Eating or drinking cold or frozen liquids, such as frozen ice pops. ? Gargling with a salt-water mixture 3-4 times a day or as needed. To make a salt-water mixture, add -1 tsp of salt in 1 cup of warm water. Mix it until you cannot see the salt anymore. ? Sucking on hard candy or throat lozenges. ? Putting a cool-mist humidifier in your bedroom at night. ? Sitting in the bathroom with the  door closed for 5-10 minutes while you run hot water in the shower.  Do not use any tobacco products, such as cigarettes, chewing tobacco, and e-cigarettes. If you need help quitting, ask your doctor. Contact a doctor if:  You have a fever for more than 2-3 days.  You keep having symptoms for more than 2-3 days.  Your throat does not get better in 7 days.  You have a fever and your symptoms suddenly get worse. Get help right away if:  You have trouble breathing.  You cannot swallow fluids, soft foods, or your saliva.  You have swelling in your throat or neck that gets worse.  You keep feeling like you are going to throw up (vomit).  You keep throwing up. This information is not intended to replace advice given to you by your health care provider. Make sure you discuss any questions you have with your health care provider. Document Released: 04/18/2008 Document Revised: 03/05/2016 Document Reviewed: 04/30/2015 Elsevier Interactive Patient Education  2018 Elsevier Inc.  

## 2018-02-28 LAB — CULTURE, GROUP A STREP: Strep A Culture: NEGATIVE

## 2018-10-02 ENCOUNTER — Emergency Department (HOSPITAL_COMMUNITY): Payer: PRIVATE HEALTH INSURANCE

## 2018-10-02 ENCOUNTER — Other Ambulatory Visit: Payer: Self-pay

## 2018-10-02 ENCOUNTER — Encounter (HOSPITAL_COMMUNITY): Payer: Self-pay

## 2018-10-02 ENCOUNTER — Observation Stay (HOSPITAL_COMMUNITY)
Admission: EM | Admit: 2018-10-02 | Discharge: 2018-10-04 | Disposition: A | Discharge status: Home / Self Care | Payer: PRIVATE HEALTH INSURANCE | Attending: Surgery | Admitting: Surgery

## 2018-10-02 DIAGNOSIS — R1010 Upper abdominal pain, unspecified: Secondary | ICD-10-CM | POA: Diagnosis present

## 2018-10-02 DIAGNOSIS — Z79899 Other long term (current) drug therapy: Secondary | ICD-10-CM | POA: Diagnosis not present

## 2018-10-02 DIAGNOSIS — K8000 Calculus of gallbladder with acute cholecystitis without obstruction: Secondary | ICD-10-CM | POA: Diagnosis not present

## 2018-10-02 DIAGNOSIS — Z818 Family history of other mental and behavioral disorders: Secondary | ICD-10-CM | POA: Insufficient documentation

## 2018-10-02 DIAGNOSIS — R1084 Generalized abdominal pain: Secondary | ICD-10-CM

## 2018-10-02 DIAGNOSIS — F329 Major depressive disorder, single episode, unspecified: Secondary | ICD-10-CM | POA: Diagnosis not present

## 2018-10-02 DIAGNOSIS — R109 Unspecified abdominal pain: Secondary | ICD-10-CM

## 2018-10-02 DIAGNOSIS — Z7984 Long term (current) use of oral hypoglycemic drugs: Secondary | ICD-10-CM | POA: Diagnosis not present

## 2018-10-02 LAB — COMPREHENSIVE METABOLIC PANEL
ALK PHOS: 51 U/L (ref 38–126)
ALT: 25 U/L (ref 0–44)
AST: 23 U/L (ref 15–41)
Albumin: 3.7 g/dL (ref 3.5–5.0)
Anion gap: 8 (ref 5–15)
BILIRUBIN TOTAL: 0.4 mg/dL (ref 0.3–1.2)
BUN: 11 mg/dL (ref 6–20)
CO2: 24 mmol/L (ref 22–32)
Calcium: 9.6 mg/dL (ref 8.9–10.3)
Chloride: 105 mmol/L (ref 98–111)
Creatinine, Ser: 0.79 mg/dL (ref 0.44–1.00)
GFR calc Af Amer: >60 mL/min (ref >60)
GFR calc non Af Amer: >60 mL/min (ref >60)
Glucose, Bld: 96 mg/dL (ref 70–99)
POTASSIUM: 4.1 mmol/L (ref 3.5–5.1)
Sodium: 137 mmol/L (ref 135–145)
Total Protein: 7.3 g/dL (ref 6.5–8.1)

## 2018-10-02 LAB — I-STAT BETA HCG BLOOD, ED (MC, WL, AP ONLY)

## 2018-10-02 LAB — CBC
HCT: 38.8 % (ref 36.0–46.0)
Hemoglobin: 12.7 g/dL (ref 12.0–15.0)
MCH: 27.9 pg (ref 26.0–34.0)
MCHC: 32.7 g/dL (ref 30.0–36.0)
MCV: 85.3 fL (ref 80.0–100.0)
Platelets: 443 10*3/uL — ABNORMAL HIGH (ref 150–400)
RBC: 4.55 MIL/uL (ref 3.87–5.11)
RDW: 13 % (ref 11.5–15.5)
WBC: 11.8 10*3/uL — ABNORMAL HIGH (ref 4.0–10.5)
nRBC: 0 % (ref 0.0–0.2)

## 2018-10-02 LAB — URINALYSIS, ROUTINE W REFLEX MICROSCOPIC
Bacteria, UA: NONE SEEN
Bilirubin Urine: NEGATIVE
Glucose, UA: NEGATIVE mg/dL
Ketones, ur: NEGATIVE mg/dL
Leukocytes,Ua: NEGATIVE
Nitrite: NEGATIVE
Protein, ur: NEGATIVE mg/dL
SPECIFIC GRAVITY, URINE: 1.005 (ref 1.005–1.030)
pH: 6 (ref 5.0–8.0)

## 2018-10-02 LAB — LIPASE, BLOOD: Lipase: 26 U/L (ref 11–51)

## 2018-10-02 MED ORDER — IOHEXOL 300 MG/ML  SOLN
100.0000 mL | Freq: Once | INTRAMUSCULAR | Status: AC | PRN
  Administered 2018-10-02: 100 mL via INTRAVENOUS

## 2018-10-02 MED ORDER — SERTRALINE HCL 50 MG PO TABS
25.0000 mg | ORAL_TABLET | Freq: Every day | ORAL | Status: DC
  Filled 2018-10-02: qty 1

## 2018-10-02 MED ORDER — SODIUM CHLORIDE 0.9 % IV SOLN
2.0000 g | INTRAVENOUS | Status: DC
  Administered 2018-10-02: 2 g via INTRAVENOUS
  Filled 2018-10-02 (×2): qty 20

## 2018-10-02 MED ORDER — KETOROLAC TROMETHAMINE 30 MG/ML IJ SOLN
30.0000 mg | Freq: Once | INTRAMUSCULAR | Status: AC
  Administered 2018-10-02: 30 mg via INTRAVENOUS
  Filled 2018-10-02: qty 1

## 2018-10-02 MED ORDER — KETOROLAC TROMETHAMINE 30 MG/ML IJ SOLN: 30.0000 mg | Freq: Four times a day (QID) | INTRAMUSCULAR | Status: DC | PRN

## 2018-10-02 MED ORDER — HYDROMORPHONE HCL 1 MG/ML IJ SOLN
0.5000 mg | Freq: Once | INTRAMUSCULAR | Status: AC
  Administered 2018-10-02: 0.5 mg via INTRAVENOUS
  Filled 2018-10-02: qty 1

## 2018-10-02 MED ORDER — ZOLPIDEM TARTRATE 5 MG PO TABS: 5.0000 mg | ORAL_TABLET | Freq: Every evening | ORAL | Status: DC | PRN

## 2018-10-02 MED ORDER — SENNOSIDES-DOCUSATE SODIUM 8.6-50 MG PO TABS: 1.0000 | ORAL_TABLET | Freq: Every evening | ORAL | Status: DC | PRN

## 2018-10-02 MED ORDER — DIPHENHYDRAMINE HCL 12.5 MG/5ML PO ELIX: 12.5000 mg | ORAL_SOLUTION | Freq: Four times a day (QID) | ORAL | Status: DC | PRN

## 2018-10-02 MED ORDER — OXYCODONE HCL 5 MG PO TABS
5.0000 mg | ORAL_TABLET | ORAL | Status: DC | PRN
  Administered 2018-10-04: 5 mg via ORAL
  Filled 2018-10-02: qty 1

## 2018-10-02 MED ORDER — KETOROLAC TROMETHAMINE 30 MG/ML IJ SOLN
30.0000 mg | Freq: Four times a day (QID) | INTRAMUSCULAR | Status: DC
  Administered 2018-10-03 (×2): 30 mg via INTRAVENOUS
  Filled 2018-10-02 (×3): qty 1

## 2018-10-02 MED ORDER — ACETAMINOPHEN 650 MG RE SUPP: 650.0000 mg | Freq: Four times a day (QID) | RECTAL | Status: DC | PRN

## 2018-10-02 MED ORDER — DROSPIRENONE-ETHINYL ESTRADIOL 3-0.02 MG PO TABS: 1.0000 | ORAL_TABLET | Freq: Every day | ORAL | Status: DC

## 2018-10-02 MED ORDER — LORATADINE 10 MG PO TABS
10.0000 mg | ORAL_TABLET | Freq: Every day | ORAL | Status: DC
  Filled 2018-10-02: qty 1

## 2018-10-02 MED ORDER — VALACYCLOVIR HCL 500 MG PO TABS
500.0000 mg | ORAL_TABLET | Freq: Every day | ORAL | Status: DC
  Filled 2018-10-02 (×2): qty 1

## 2018-10-02 MED ORDER — SIMETHICONE 80 MG PO CHEW: 40.0000 mg | CHEWABLE_TABLET | Freq: Four times a day (QID) | ORAL | Status: DC | PRN

## 2018-10-02 MED ORDER — HYDROMORPHONE HCL 1 MG/ML IJ SOLN: 0.5000 mg | INTRAMUSCULAR | Status: DC | PRN

## 2018-10-02 MED ORDER — ONDANSETRON HCL 4 MG/2ML IJ SOLN
4.0000 mg | Freq: Once | INTRAMUSCULAR | Status: AC
  Administered 2018-10-02: 4 mg via INTRAVENOUS
  Filled 2018-10-02: qty 2

## 2018-10-02 MED ORDER — FENTANYL CITRATE (PF) 100 MCG/2ML IJ SOLN
50.0000 ug | Freq: Once | INTRAMUSCULAR | Status: AC
  Administered 2018-10-02: 50 ug via INTRAVENOUS
  Filled 2018-10-02: qty 2

## 2018-10-02 MED ORDER — ONDANSETRON 4 MG PO TBDP: 4.0000 mg | ORAL_TABLET | Freq: Four times a day (QID) | ORAL | Status: DC | PRN

## 2018-10-02 MED ORDER — METHOCARBAMOL 500 MG PO TABS: 500.0000 mg | ORAL_TABLET | Freq: Four times a day (QID) | ORAL | Status: DC | PRN

## 2018-10-02 MED ORDER — ACETAMINOPHEN 325 MG PO TABS: 650.0000 mg | ORAL_TABLET | Freq: Four times a day (QID) | ORAL | Status: DC | PRN

## 2018-10-02 MED ORDER — ONDANSETRON HCL 4 MG/2ML IJ SOLN: 4.0000 mg | Freq: Four times a day (QID) | INTRAMUSCULAR | Status: DC | PRN

## 2018-10-02 MED ORDER — KCL IN DEXTROSE-NACL 20-5-0.45 MEQ/L-%-% IV SOLN
INTRAVENOUS | Status: DC
  Administered 2018-10-02 – 2018-10-03 (×3): via INTRAVENOUS
  Filled 2018-10-02 (×3): qty 1000

## 2018-10-02 MED ORDER — SODIUM CHLORIDE 0.9% FLUSH: 3.0000 mL | Freq: Once | INTRAVENOUS | Status: DC

## 2018-10-02 MED ORDER — DIPHENHYDRAMINE HCL 50 MG/ML IJ SOLN: 12.5000 mg | Freq: Four times a day (QID) | INTRAMUSCULAR | Status: DC | PRN

## 2018-10-02 NOTE — ED Notes (Signed)
Back from [place].

## 2018-10-02 NOTE — ED Triage Notes (Signed)
Pt states she has been having abdominal pain radiating to her back since 0900. States severe nausea. Pt tearful in traige. LMP was 09/29/18.
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
## 2018-10-02 NOTE — H&P (Signed)
Tonya Ellis is an 28 y.o. female.   Chief Complaint: abdominal pain HPI:  Pt is a 28 yo F who presented to the ED with upper abdominal pain that started this morning around 1 hour after oatmeal and hardboiled eggs. It was like a band around her abdomen that settled in the epigastric location and the back.  She was unable to eat anything after that.  The pain became worse and crampy.  She tried to stay at work but the pain got worse and worse and worse.  She also felt quite bloated.    She had another episode last night that was mostly in her flank, but this one went away on its own.  She had a hard time getting comfortable last night because of back pain and felt like she didn't sleep well.  She denies fever/chills/jaundice.  No recent travel.  No diarrhea or changes in bowel habits.  No known FH of gallbladder disease.    Past Medical History:  Diagnosis Date  . Depression    ?bipolar, no meds- doing well  . H/O varicella   . HSV-2 infection   . PCOS (polycystic ovarian syndrome) 02/17/2014  . Pyelonephritis during pregnancy, antepartum 2013    Past Surgical History:  Procedure Laterality Date  . WISDOM TOOTH EXTRACTION      Family History  Problem Relation Age of Onset  . Mental illness Mother        bipolar disorder  . Hyperlipidemia Mother   . Fibromyalgia Mother   . Obesity Mother   . Drug abuse Father        reportedly heroin addict  . Cancer Maternal Grandmother        breast   . Mental illness Maternal Grandmother   . Mental illness Sister        depression  . Obesity Brother   . Diabetes Paternal Grandfather   . Anesthesia problems Neg Hx   . Other Neg Hx    Social History:  reports that she has never smoked. She has never used smokeless tobacco. She reports current alcohol use. She reports that she does not use drugs.  Allergies: No Known Allergies  Current Meds  Medication Sig  . drospirenone-ethinyl estradiol (YAZ,GIANVI,LORYNA) 3-0.02 MG tablet Take 1  tablet by mouth daily.  Marland Kitchen loratadine (CLARITIN) 10 MG tablet Take 10 mg by mouth daily.  . metFORMIN (GLUCOPHAGE) 500 MG tablet Take 2 tablets (1,000 mg total) by mouth 2 (two) times daily with a meal.  . Multiple Vitamins-Minerals (MULTIVITAMIN PO) Take 1 tablet by mouth daily.  . sertraline (ZOLOFT) 25 MG tablet Take 25 mg by mouth daily.  . valACYclovir (VALTREX) 500 MG tablet Take 1 tablet by mouth twice daily for 3 days for outbreak; take 1 tablet every day for suppression     Results for orders placed or performed during the hospital encounter of 10/02/18 (from the past 48 hour(s))  Lipase, blood     Status: None   Collection Time: 10/02/18  1:02 PM  Result Value Ref Range   Lipase 26 11 - 51 U/L    Comment: Performed at [facility], [address] [place], [place] [address]  Comprehensive metabolic panel     Status: None   Collection Time: 10/02/18  1:02 PM  Result Value Ref Range   Sodium 137 135 - 145 mmol/L   Potassium 4.1 3.5 - 5.1 mmol/L   Chloride 105 98 - 111 mmol/L   CO2 24 22 -  32 mmol/L   Glucose, Bld 96 70 - 99 mg/dL   BUN 11 6 - 20 mg/dL   Creatinine, Ser 4.09 0.44 - 1.00 mg/dL   Calcium 9.6 8.9 - 81.1 mg/dL   Total Protein 7.3 6.5 - 8.1 g/dL   Albumin 3.7 3.5 - 5.0 g/dL   AST 23 15 - 41 U/L   ALT 25 0 - 44 U/L   Alkaline Phosphatase 51 38 - 126 U/L   Total Bilirubin 0.4 0.3 - 1.2 mg/dL   GFR calc non Af Amer >60 >60 mL/min   GFR calc Af Amer >60 >60 mL/min   Anion gap 8 5 - 15    Comment: Performed at [facility], [address] [place], [place] [address]  CBC     Status: Abnormal   Collection Time: 10/02/18  1:02 PM  Result Value Ref Range   WBC 11.8 (H) 4.0 - 10.5 K/uL   RBC 4.55 3.87 - 5.11 MIL/uL   Hemoglobin 12.7 12.0 - 15.0 g/dL   HCT 29.5 62.1 - 30.8 %   MCV 85.3 80.0 - 100.0 fL   MCH 27.9 26.0 - 34.0 pg   MCHC 32.7 30.0 - 36.0 g/dL   RDW 65.7 84.6 - 96.2 %   Platelets 443 (H) 150 - 400 K/uL   nRBC 0.0 0.0 - 0.2 %    Comment:  Performed at [facility], [address], [place], [place] [address]  Urinalysis, Routine w reflex microscopic     Status: Abnormal   Collection Time: 10/02/18  1:02 PM  Result Value Ref Range   Color, Urine STRAW (A) YELLOW   APPearance CLEAR CLEAR   Specific Gravity, Urine 1.005 1.005 - 1.030   pH 6.0 5.0 - 8.0   Glucose, UA NEGATIVE NEGATIVE mg/dL   Hgb urine dipstick SMALL (A) NEGATIVE   Bilirubin Urine NEGATIVE NEGATIVE   Ketones, ur NEGATIVE NEGATIVE mg/dL   Protein, ur NEGATIVE NEGATIVE mg/dL   Nitrite NEGATIVE NEGATIVE   Leukocytes,Ua NEGATIVE NEGATIVE   RBC / HPF 0-5 0 - 5 RBC/hpf   WBC, UA 0-5 0 - 5 WBC/hpf   Bacteria, UA NONE SEEN NONE SEEN   Squamous Epithelial / LPF 0-5 0 - 5    Comment: Performed at [facility], [address], [place], [place] [address]  I-Stat beta hCG blood, ED     Status: None   Collection Time: 10/02/18  1:08 PM  Result Value Ref Range   I-stat hCG, quantitative <5.0 <5 mIU/mL   Comment 3            Comment:   GEST. AGE      CONC.  (mIU/mL)   <=1 WEEK        5 - 50     2 WEEKS       50 - 500     3 WEEKS       100 - 10,000     4 WEEKS     1,000 - 30,000        FEMALE AND NON-PREGNANT FEMALE:     LESS THAN 5 mIU/mL    Ct Abdomen Pelvis W Contrast  Result Date: 10/02/2018 CLINICAL DATA:  28 year old female with abdominal pain radiating to the back since 0900 hours with severe nausea. EXAM: CT ABDOMEN AND PELVIS WITH CONTRAST TECHNIQUE: Multidetector CT imaging of the abdomen and pelvis was performed using the standard protocol following bolus administration of intravenous contrast. CONTRAST:  OMNIPAQUE IOHEXOL 300 MG/ML  SOLN COMPARISON:  Abdominal radiographs 04/03/2013. FINDINGS: Lower chest: Negative. Hepatobiliary: Questionable indistinct appearance of the gallbladder wall and perhaps trace pericholecystic inflammation (coronal image 60). No hyper enhancement of the adjacent liver parenchyma. No cholelithiasis is evident.  Negative liver. No bile duct enlargement. Pancreas: Negative. Spleen: Negative. Adrenals/Urinary Tract: Normal adrenal glands. Symmetric and normal bilateral renal enhancement. No hydronephrosis. Proximal ureters are decompressed. No nephrolithiasis identified. Unremarkable urinary bladder. Stomach/Bowel: Negative large bowel aside from mild retained stool, redundant sigmoid. Normal appendix on coronal image 60. negative terminal ileum. No dilated small bowel. Negative stomach. No mesenteric inflammatory stranding. No free air, free fluid. Vascular/Lymphatic: Major arterial structures are patent and appear normal. Portal venous system is patent. Reproductive: Within normal limits. Other: No pelvic free fluid. Musculoskeletal: Negative. IMPRESSION: 1. Questionable gallbladder inflammation. Consider Acute Cholecystitis in this clinical setting and recommend Right Upper Quadrant Ultrasound to evaluate further. 2. Otherwise negative CT Abdomen and Pelvis.  Normal appendix. Electronically Signed   By: Odessa Fleming M.D.   On: 10/02/2018 17:42   US Abdomen Limited Ruq  Result Date: 10/02/2018 CLINICAL DATA:  Abdominal pain. EXAM: ULTRASOUND ABDOMEN LIMITED RIGHT UPPER QUADRANT COMPARISON:  10/02/2018 CT. FINDINGS: Gallbladder: 1.8 cm gallstone within the neck. Wall thickening at 6 mm. Suggestion of wall edema on image 3. Sonographic Murphy's sign was not elicited. Common bile duct: Diameter: Normal, 3 mm. Liver: No focal lesion identified. Within normal limits in parenchymal echogenicity. Portal vein is patent on color Doppler imaging with normal direction of blood flow towards the liver. IMPRESSION: Cholelithiasis with gallbladder wall thickening and suggestion of edema. No sonographic Murphy's sign to confirm acute cholecystitis. If this remains a clinical concern, consider nuclear medicine hepatobiliary study. Electronically Signed   By: Jeronimo Greaves M.D.   On: 10/02/2018 18:58    Review of Systems  Constitutional:  Negative.   HENT: Negative.   Respiratory: Negative.   Cardiovascular: Negative.   Gastrointestinal: Positive for abdominal pain and nausea.  Genitourinary: Negative.   Musculoskeletal: Negative.   Skin: Negative.   Neurological: Negative.   Endo/Heme/Allergies: Negative.   Psychiatric/Behavioral: Negative.   All other systems reviewed and are negative.  Blood pressure 109/71, pulse 79, temperature 98.5 F (36.9 C), temperature source Oral, resp. rate 16, last menstrual period 09/29/2018, SpO2 98 %.   Physical Exam  Constitutional: She is oriented to person, place, and time. She appears well-developed and well-nourished. She appears distressed.  HENT:  Head: Normocephalic and atraumatic.  Right Ear: External ear normal.  Left Ear: External ear normal.  Eyes: Pupils are equal, round, and reactive to light. Conjunctivae are normal. Right eye exhibits no discharge. Left eye exhibits no discharge. No scleral icterus.  Neck: Normal range of motion. No tracheal deviation present.  Cardiovascular: Normal rate, regular rhythm, normal heart sounds and intact distal pulses. Exam reveals no gallop and no friction rub.  No murmur heard. Respiratory: Effort normal and breath sounds normal. No respiratory distress. She has no wheezes. She has no rales. She exhibits no tenderness.  GI: Soft. She exhibits no distension and no mass. There is abdominal tenderness (RUQ). There is no rebound and no guarding.  Musculoskeletal: Normal range of motion.        General: No tenderness, deformity or edema.  Lymphadenopathy:    She has no cervical adenopathy.  Neurological: She is alert and oriented to person, place, and time. Coordination normal.  Skin: Skin is warm and dry. No rash noted. She  is not diaphoretic. No erythema. No pallor.  Tattoo upper chest.   Psychiatric: She has a normal mood and affect. Her behavior is normal. Judgment and thought content normal.     Assessment/Plan Acute calculous  cholecystitis  NPO after midnight, may have clears until then.   IV fluids IV antibiotics OR would be planned tomorrow with Dr. Luisa Hartornett barring unforeseen circumstances.  Discussed lap chole with patient.    Almond LintFaera Trinda Harlacher, MD 10/02/2018, 9:12 PM

## 2018-10-02 NOTE — ED Notes (Signed)
Patient transported to CT 

## 2018-10-02 NOTE — ED Notes (Signed)
ED TO INPATIENT HANDOFF REPORT  ED Nurse Name and Phone #: [phone]  S Name/Age/Gender Tonya Ellis 28 y.o. female Room/Bed: 010C/010C  Code Status   Code Status: Prior  Home/SNF/Other Home Patient oriented to: self, place, time and situation Is this baseline? Yes   Triage Complete: Triage complete  Chief Complaint abd/spinal/chest pain  Triage Note Pt states she has been having abdominal pain radiating to her back since 0900. States severe nausea. Pt tearful in traige. LMP was 09/29/18.    Allergies No Known Allergies  Level of Care/Admitting Diagnosis ED Disposition    ED Disposition Condition Comment   Admit  Hospital Area: [facility] [[id number]]  Level of Care: Med-Surg [16]  Diagnosis: Acute calculous cholecystitis [704888]  Admitting Physician: CCS, MD [[id number]]  Attending Physician: CCS, MD [[id number]]  PT Class (Do Not Modify): Observation [104]  PT Acc Code (Do Not Modify): Observation [10022]       B Medical/Surgery History Past Medical History:  Diagnosis Date  . Depression    ?bipolar, no meds- doing well  . H/O varicella   . HSV-2 infection   . PCOS (polycystic ovarian syndrome) 02/17/2014  . Pyelonephritis during pregnancy, antepartum 2013   Past Surgical History:  Procedure Laterality Date  . WISDOM TOOTH EXTRACTION       A IV Location/Drains/Wounds Patient Lines/Drains/Airways Status   Active Line/Drains/Airways    Name:   Placement date:   Placement time:   Site:   Days:   Peripheral IV 10/02/18 Right Antecubital   10/02/18    1639    Antecubital   less than 1          Intake/Output Last 24 hours No intake or output data in the 24 hours ending 10/02/18 2157  Labs/Imaging Results for orders placed or performed during the hospital encounter of 10/02/18 (from the past 48 hour(s))  Lipase, blood     Status: None   Collection Time: 10/02/18  1:02 PM  Result Value Ref Range   Lipase 26 11 - 51 U/L    Comment: Performed  at [facility], [address], [place], [place] [address]  Comprehensive metabolic panel     Status: None   Collection Time: 10/02/18  1:02 PM  Result Value Ref Range   Sodium 137 135 - 145 mmol/L   Potassium 4.1 3.5 - 5.1 mmol/L   Chloride 105 98 - 111 mmol/L   CO2 24 22 - 32 mmol/L   Glucose, Bld 96 70 - 99 mg/dL   BUN 11 6 - 20 mg/dL   Creatinine, Ser 5.03 0.44 - 1.00 mg/dL   Calcium 9.6 8.9 - 88.8 mg/dL   Total Protein 7.3 6.5 - 8.1 g/dL   Albumin 3.7 3.5 - 5.0 g/dL   AST 23 15 - 41 U/L   ALT 25 0 - 44 U/L   Alkaline Phosphatase 51 38 - 126 U/L   Total Bilirubin 0.4 0.3 - 1.2 mg/dL   GFR calc non Af Amer >60 >60 mL/min   GFR calc Af Amer >60 >60 mL/min   Anion gap 8 5 - 15    Comment: Performed at [facility], [address] [place], [place] [address]  CBC     Status: Abnormal   Collection Time: 10/02/18  1:02 PM  Result Value Ref Range   WBC 11.8 (H) 4.0 - 10.5 K/uL   RBC 4.55 3.87 - 5.11 MIL/uL   Hemoglobin 12.7 12.0 - 15.0  g/dL   HCT 65.7 84.6 - 96.2 %   MCV 85.3 80.0 - 100.0 fL   MCH 27.9 26.0 - 34.0 pg   MCHC 32.7 30.0 - 36.0 g/dL   RDW 95.2 84.1 - 32.4 %   Platelets 443 (H) 150 - 400 K/uL   nRBC 0.0 0.0 - 0.2 %    Comment: Performed at [facility], [address], [place], [place] [address]  Urinalysis, Routine w reflex microscopic     Status: Abnormal   Collection Time: 10/02/18  1:02 PM  Result Value Ref Range   Color, Urine STRAW (A) YELLOW   APPearance CLEAR CLEAR   Specific Gravity, Urine 1.005 1.005 - 1.030   pH 6.0 5.0 - 8.0   Glucose, UA NEGATIVE NEGATIVE mg/dL   Hgb urine dipstick SMALL (A) NEGATIVE   Bilirubin Urine NEGATIVE NEGATIVE   Ketones, ur NEGATIVE NEGATIVE mg/dL   Protein, ur NEGATIVE NEGATIVE mg/dL   Nitrite NEGATIVE NEGATIVE   Leukocytes,Ua NEGATIVE NEGATIVE   RBC / HPF 0-5 0 - 5 RBC/hpf   WBC, UA 0-5 0 - 5 WBC/hpf   Bacteria, UA NONE SEEN NONE SEEN   Squamous Epithelial / LPF 0-5 0 - 5    Comment: Performed  at [facility], [address], [place], [place] [address]  I-Stat beta hCG blood, ED     Status: None   Collection Time: 10/02/18  1:08 PM  Result Value Ref Range   I-stat hCG, quantitative <5.0 <5 mIU/mL   Comment 3            Comment:   GEST. AGE      CONC.  (mIU/mL)   <=1 WEEK        5 - 50     2 WEEKS       50 - 500     3 WEEKS       100 - 10,000     4 WEEKS     1,000 - 30,000        FEMALE AND NON-PREGNANT FEMALE:     LESS THAN 5 mIU/mL    Ct Abdomen Pelvis W Contrast  Result Date: 10/02/2018 CLINICAL DATA:  28 year old female with abdominal pain radiating to the back since 0900 hours with severe nausea. EXAM: CT ABDOMEN AND PELVIS WITH CONTRAST TECHNIQUE: Multidetector CT imaging of the abdomen and pelvis was performed using the standard protocol following bolus administration of intravenous contrast. CONTRAST:  OMNIPAQUE IOHEXOL 300 MG/ML  SOLN COMPARISON:  Abdominal radiographs 04/03/2013. FINDINGS: Lower chest: Negative. Hepatobiliary: Questionable indistinct appearance of the gallbladder wall and perhaps trace pericholecystic inflammation (coronal image 60). No hyper enhancement of the adjacent liver parenchyma. No cholelithiasis is evident. Negative liver. No bile duct enlargement. Pancreas: Negative. Spleen: Negative. Adrenals/Urinary Tract: Normal adrenal glands. Symmetric and normal bilateral renal enhancement. No hydronephrosis. Proximal ureters are decompressed. No nephrolithiasis identified. Unremarkable urinary bladder. Stomach/Bowel: Negative large bowel aside from mild retained stool, redundant sigmoid. Normal appendix on coronal image 60. negative terminal ileum. No dilated small bowel. Negative stomach. No mesenteric inflammatory stranding. No free air, free fluid. Vascular/Lymphatic: Major arterial structures are patent and appear normal. Portal venous system is patent. Reproductive: Within normal limits. Other: No pelvic free fluid. Musculoskeletal: Negative.  IMPRESSION: 1. Questionable gallbladder inflammation. Consider Acute Cholecystitis in this clinical setting and recommend Right Upper Quadrant Ultrasound to evaluate further. 2. Otherwise negative CT Abdomen and Pelvis.  Normal appendix. Electronically Signed   By: Rexene Edison  Margo Aye M.D.   On: 10/02/2018 17:42   US Abdomen Limited Ruq  Result Date: 10/02/2018 CLINICAL DATA:  Abdominal pain. EXAM: ULTRASOUND ABDOMEN LIMITED RIGHT UPPER QUADRANT COMPARISON:  10/02/2018 CT. FINDINGS: Gallbladder: 1.8 cm gallstone within the neck. Wall thickening at 6 mm. Suggestion of wall edema on image 3. Sonographic Murphy's sign was not elicited. Common bile duct: Diameter: Normal, 3 mm. Liver: No focal lesion identified. Within normal limits in parenchymal echogenicity. Portal vein is patent on color Doppler imaging with normal direction of blood flow towards the liver. IMPRESSION: Cholelithiasis with gallbladder wall thickening and suggestion of edema. No sonographic Murphy's sign to confirm acute cholecystitis. If this remains a clinical concern, consider nuclear medicine hepatobiliary study. Electronically Signed   By: Jeronimo Greaves M.D.   On: 10/02/2018 18:58    Pending Labs [employer] (From admission, onward)    Start     Ordered   Signed and Held  HIV antibody (Routine Testing)  Once,   R     Signed and Held   Signed and Held  Comprehensive metabolic panel  Tomorrow morning,   R     Signed and Held   Signed and Held  CBC  Tomorrow morning,   R     Signed and Held          Vitals/Pain Today's Vitals   10/02/18 1230 10/02/18 1245 10/02/18 1419 10/02/18 2010  BP: 133/83   109/71  Pulse: 78   79  Resp: 20   16  Temp: 98.5 F (36.9 C)     TempSrc: Oral     SpO2: 99%   98%  PainSc:  8  4      Isolation Precautions No active isolations  Medications Medications  sodium chloride flush (NS) 0.9 % injection 3 mL (3 mLs Intravenous Not Given 10/02/18 1828)  iohexol (OMNIPAQUE) 300 MG/ML solution 100 mL  (100 mLs Intravenous Contrast Given 10/02/18 1724)  fentaNYL (SUBLIMAZE) injection 50 mcg (50 mcg Intravenous Given 10/02/18 2013)  ketorolac (TORADOL) 30 MG/ML injection 30 mg (30 mg Intravenous Given 10/02/18 2148)  ondansetron (ZOFRAN) injection 4 mg (4 mg Intravenous Given 10/02/18 2147)  HYDROmorphone (DILAUDID) injection 0.5 mg (0.5 mg Intravenous Given 10/02/18 2148)    Mobility walks Low fall risk   Focused Assessments Cardiac Assessment Handoff:    No results found for: CKTOTAL, CKMB, CKMBINDEX, TROPONINI No results found for: DDIMER Does the Patient currently have chest pain? No      R Recommendations: See Admitting Provider Note  Report given to:   Additional Notes:

## 2018-10-02 NOTE — ED Provider Notes (Signed)
Care assumed from [facility], PA-C at shift change with CT abd/pelvis pending.   In brief, this patient is a 28 y.o. who presents for evaluation of abdominal pain.  She reports last night, she had an episode of right flank pain that began acutely and then resolved.  Reports this morning, she had a 4-hour episode of upper abdominal pain that she states has gotten better throughout the day.  Patient denies any vomiting or diarrhea.  On exam, patient with some mild right lower quadrant tenderness.  PLAN: Patient pending CT of the pelvis for evaluation of abdominal pain.  No concern for pelvic etiology.  MDM:  CT scan shows questionable gallbladder inflammation.  Recommend right upper quadrant ultrasound for further evaluation.  Discussed results with patient.  She still has some tenderness under the right upper quadrant.  We will plan to get further ultrasound evaluation.  U/S shows 1.8 cm gallstone within the neck. Wall thickening noted at 61mm. No CBD dilation. Evidence of cholelithiasis with gallbladder wall thickening. Question acute cholecystitis.   Discussed patient with Dr. Basil Dess (Gen Surg). Will evaluate patient in the ED.   Discussed patient with Dr. Basil Dess after evaluation in the ED. Surg will admit patient.   Portions of this note were generated with Scientist, clinical (histocompatibility and immunogenetics). Dictation errors may occur despite best attempts at proofreading.    1. Generalized abdominal pain   2. Abdominal pain   3. Calculus of gallbladder with acute cholecystitis without obstruction      Maxwell Caul, PA-C 10/02/18 2133    Sabas Sous, MD 10/03/18 1950

## 2018-10-02 NOTE — ED Notes (Signed)
Patient transported to US 

## 2018-10-03 ENCOUNTER — Encounter (HOSPITAL_COMMUNITY)
Admission: EM | Disposition: A | Discharge status: Home / Self Care | Payer: Self-pay | Source: Home / Self Care | Attending: Emergency Medicine

## 2018-10-03 ENCOUNTER — Encounter (HOSPITAL_COMMUNITY): Payer: Self-pay

## 2018-10-03 ENCOUNTER — Observation Stay (HOSPITAL_COMMUNITY): Payer: PRIVATE HEALTH INSURANCE | Admitting: Certified Registered Nurse Anesthetist

## 2018-10-03 HISTORY — PX: CHOLECYSTECTOMY: SHX55

## 2018-10-03 LAB — COMPREHENSIVE METABOLIC PANEL
ALT: 24 U/L (ref 0–44)
ANION GAP: 8 (ref 5–15)
AST: 23 U/L (ref 15–41)
Albumin: 2.9 g/dL — ABNORMAL LOW (ref 3.5–5.0)
Alkaline Phosphatase: 45 U/L (ref 38–126)
BUN: 8 mg/dL (ref 6–20)
CO2: 23 mmol/L (ref 22–32)
Calcium: 8.5 mg/dL — ABNORMAL LOW (ref 8.9–10.3)
Chloride: 107 mmol/L (ref 98–111)
Creatinine, Ser: 0.77 mg/dL (ref 0.44–1.00)
GFR calc Af Amer: >60 mL/min (ref >60)
GFR calc non Af Amer: >60 mL/min (ref >60)
Glucose, Bld: 107 mg/dL — ABNORMAL HIGH (ref 70–99)
Potassium: 3.6 mmol/L (ref 3.5–5.1)
Sodium: 138 mmol/L (ref 135–145)
TOTAL PROTEIN: 6.2 g/dL — AB (ref 6.5–8.1)
Total Bilirubin: 0.5 mg/dL (ref 0.3–1.2)

## 2018-10-03 LAB — SURGICAL PCR SCREEN
MRSA, PCR: NEGATIVE
Staphylococcus aureus: NEGATIVE

## 2018-10-03 LAB — CBC
HCT: 34.2 % — ABNORMAL LOW (ref 36.0–46.0)
Hemoglobin: 10.9 g/dL — ABNORMAL LOW (ref 12.0–15.0)
MCH: 27 pg (ref 26.0–34.0)
MCHC: 31.9 g/dL (ref 30.0–36.0)
MCV: 84.7 fL (ref 80.0–100.0)
Platelets: 355 10*3/uL (ref 150–400)
RBC: 4.04 MIL/uL (ref 3.87–5.11)
RDW: 13 % (ref 11.5–15.5)
WBC: 9.2 10*3/uL (ref 4.0–10.5)
nRBC: 0 % (ref 0.0–0.2)

## 2018-10-03 LAB — HIV ANTIBODY (ROUTINE TESTING W REFLEX): HIV Screen 4th Generation wRfx: NONREACTIVE

## 2018-10-03 SURGERY — LAPAROSCOPIC CHOLECYSTECTOMY WITH INTRAOPERATIVE CHOLANGIOGRAM
Anesthesia: General | Site: Abdomen

## 2018-10-03 MED ORDER — ACETAMINOPHEN 10 MG/ML IV SOLN
1000.0000 mg | Freq: Once | INTRAVENOUS | Status: DC | PRN
  Administered 2018-10-03: 1000 mg via INTRAVENOUS

## 2018-10-03 MED ORDER — FENTANYL CITRATE (PF) 100 MCG/2ML IJ SOLN
INTRAMUSCULAR | Status: DC | PRN
  Administered 2018-10-03: 50 ug via INTRAVENOUS
  Administered 2018-10-03: 100 ug via INTRAVENOUS
  Administered 2018-10-03 (×2): 50 ug via INTRAVENOUS

## 2018-10-03 MED ORDER — HYDROMORPHONE HCL 1 MG/ML IJ SOLN
0.2500 mg | INTRAMUSCULAR | Status: DC | PRN
  Administered 2018-10-03 (×2): 1 mg via INTRAVENOUS

## 2018-10-03 MED ORDER — MIDAZOLAM HCL 2 MG/2ML IJ SOLN
INTRAMUSCULAR | Status: AC
  Filled 2018-10-03: qty 2

## 2018-10-03 MED ORDER — ONDANSETRON HCL 4 MG/2ML IJ SOLN
INTRAMUSCULAR | Status: DC | PRN
  Administered 2018-10-03: 4 mg via INTRAVENOUS

## 2018-10-03 MED ORDER — ROCURONIUM BROMIDE 50 MG/5ML IV SOSY
PREFILLED_SYRINGE | INTRAVENOUS | Status: DC | PRN
  Administered 2018-10-03: 10 mg via INTRAVENOUS
  Administered 2018-10-03: 40 mg via INTRAVENOUS

## 2018-10-03 MED ORDER — SUGAMMADEX SODIUM 200 MG/2ML IV SOLN
INTRAVENOUS | Status: DC | PRN
  Administered 2018-10-03: 200 mg via INTRAVENOUS

## 2018-10-03 MED ORDER — PROPOFOL 10 MG/ML IV BOLUS
INTRAVENOUS | Status: DC | PRN
  Administered 2018-10-03: 160 mg via INTRAVENOUS

## 2018-10-03 MED ORDER — HYDROMORPHONE HCL 1 MG/ML IJ SOLN: 0.5000 mg | INTRAMUSCULAR | Status: DC | PRN

## 2018-10-03 MED ORDER — BUPIVACAINE-EPINEPHRINE 0.25% -1:200000 IJ SOLN
INTRAMUSCULAR | Status: DC | PRN
  Administered 2018-10-03: 11 mL

## 2018-10-03 MED ORDER — SCOPOLAMINE 1 MG/3DAYS TD PT72
MEDICATED_PATCH | TRANSDERMAL | Status: AC
  Filled 2018-10-03: qty 1

## 2018-10-03 MED ORDER — DEXAMETHASONE SODIUM PHOSPHATE 10 MG/ML IJ SOLN
INTRAMUSCULAR | Status: DC | PRN
  Administered 2018-10-03: 4 mg via INTRAVENOUS

## 2018-10-03 MED ORDER — PROMETHAZINE HCL 25 MG/ML IJ SOLN
6.2500 mg | INTRAMUSCULAR | Status: DC | PRN
  Administered 2018-10-03: 12.5 mg via INTRAVENOUS

## 2018-10-03 MED ORDER — ROCURONIUM BROMIDE 50 MG/5ML IV SOSY
PREFILLED_SYRINGE | INTRAVENOUS | Status: AC
  Filled 2018-10-03: qty 5

## 2018-10-03 MED ORDER — MIDAZOLAM HCL 5 MG/5ML IJ SOLN
INTRAMUSCULAR | Status: DC | PRN
  Administered 2018-10-03: 2 mg via INTRAVENOUS

## 2018-10-03 MED ORDER — PHENYLEPHRINE HCL 10 MG/ML IJ SOLN
INTRAMUSCULAR | Status: DC | PRN
  Administered 2018-10-03: 80 ug via INTRAVENOUS
  Administered 2018-10-03: 120 ug via INTRAVENOUS
  Administered 2018-10-03: 80 ug via INTRAVENOUS

## 2018-10-03 MED ORDER — SCOPOLAMINE 1 MG/3DAYS TD PT72
1.0000 | MEDICATED_PATCH | TRANSDERMAL | Status: DC
  Administered 2018-10-03: 1.5 mg via TRANSDERMAL
  Filled 2018-10-03: qty 1

## 2018-10-03 MED ORDER — ACETAMINOPHEN 10 MG/ML IV SOLN
INTRAVENOUS | Status: AC
  Filled 2018-10-03: qty 100

## 2018-10-03 MED ORDER — MEPERIDINE HCL 50 MG/ML IJ SOLN: 6.2500 mg | INTRAMUSCULAR | Status: DC | PRN

## 2018-10-03 MED ORDER — PROMETHAZINE HCL 25 MG/ML IJ SOLN
INTRAMUSCULAR | Status: AC
  Filled 2018-10-03: qty 1

## 2018-10-03 MED ORDER — LACTATED RINGERS IV SOLN
INTRAVENOUS | Status: DC
  Administered 2018-10-03: 13:00:00 via INTRAVENOUS

## 2018-10-03 MED ORDER — 0.9 % SODIUM CHLORIDE (POUR BTL) OPTIME
TOPICAL | Status: DC | PRN
  Administered 2018-10-03: 1000 mL

## 2018-10-03 MED ORDER — PROPOFOL 10 MG/ML IV BOLUS
INTRAVENOUS | Status: AC
  Filled 2018-10-03: qty 20

## 2018-10-03 MED ORDER — SODIUM CHLORIDE 0.9 % IR SOLN
Status: DC | PRN
  Administered 2018-10-03: 1000 mL

## 2018-10-03 MED ORDER — HYDROMORPHONE HCL 1 MG/ML IJ SOLN
INTRAMUSCULAR | Status: AC
  Filled 2018-10-03: qty 1

## 2018-10-03 MED ORDER — FENTANYL CITRATE (PF) 250 MCG/5ML IJ SOLN
INTRAMUSCULAR | Status: AC
  Filled 2018-10-03: qty 5

## 2018-10-03 MED ORDER — LIDOCAINE 2% (20 MG/ML) 5 ML SYRINGE
INTRAMUSCULAR | Status: DC | PRN
  Administered 2018-10-03: 100 mg via INTRAVENOUS

## 2018-10-03 MED ORDER — BUPIVACAINE-EPINEPHRINE (PF) 0.25% -1:200000 IJ SOLN
INTRAMUSCULAR | Status: AC
  Filled 2018-10-03: qty 30

## 2018-10-03 MED ORDER — HYDROCODONE-ACETAMINOPHEN 7.5-325 MG PO TABS: 1.0000 | ORAL_TABLET | Freq: Once | ORAL | Status: DC | PRN

## 2018-10-03 SURGICAL SUPPLY — 41 items
APPLIER CLIP ROT 10 11.4 M/L (STAPLE) ×3
BLADE CLIPPER SURG (BLADE) ×3 IMPLANT
CANISTER SUCT 3000ML PPV (MISCELLANEOUS) ×3 IMPLANT
CHLORAPREP W/TINT 26ML (MISCELLANEOUS) ×3 IMPLANT
CLIP APPLIE ROT 10 11.4 M/L (STAPLE) ×1 IMPLANT
COVER MAYO STAND STRL (DRAPES) ×3 IMPLANT
COVER SURGICAL LIGHT HANDLE (MISCELLANEOUS) ×3 IMPLANT
COVER WAND RF STERILE (DRAPES) IMPLANT
DERMABOND ADVANCED (GAUZE/BANDAGES/DRESSINGS) ×2
DERMABOND ADVANCED .7 DNX12 (GAUZE/BANDAGES/DRESSINGS) ×1 IMPLANT
DRAPE C-ARM 42X72 X-RAY (DRAPES) IMPLANT
DRAPE WARM FLUID 44X44 (DRAPE) IMPLANT
ELECT REM PT RETURN 9FT ADLT (ELECTROSURGICAL) ×3
ELECTRODE REM PT RTRN 9FT ADLT (ELECTROSURGICAL) ×1 IMPLANT
GLOVE BIO SURGEON STRL SZ8 (GLOVE) ×3 IMPLANT
GLOVE BIOGEL PI IND STRL 8 (GLOVE) ×1 IMPLANT
GLOVE BIOGEL PI INDICATOR 8 (GLOVE) ×2
GOWN STRL REUS W/ TWL LRG LVL3 (GOWN DISPOSABLE) ×2 IMPLANT
GOWN STRL REUS W/ TWL XL LVL3 (GOWN DISPOSABLE) ×1 IMPLANT
GOWN STRL REUS W/TWL LRG LVL3 (GOWN DISPOSABLE) ×4
GOWN STRL REUS W/TWL XL LVL3 (GOWN DISPOSABLE) ×2
KIT BASIN OR (CUSTOM PROCEDURE TRAY) ×3 IMPLANT
KIT TURNOVER KIT B (KITS) ×3 IMPLANT
NS IRRIG 1000ML POUR BTL (IV SOLUTION) ×3 IMPLANT
PAD ARMBOARD 7.5X6 YLW CONV (MISCELLANEOUS) ×3 IMPLANT
POUCH RETRIEVAL ECOSAC 10 (ENDOMECHANICALS) ×1 IMPLANT
POUCH RETRIEVAL ECOSAC 10MM (ENDOMECHANICALS) ×2
SCISSORS LAP 5X35 DISP (ENDOMECHANICALS) ×3 IMPLANT
SET CHOLANGIOGRAPH 5 50 .035 (SET/KITS/TRAYS/PACK) IMPLANT
SET IRRIG TUBING LAPAROSCOPIC (IRRIGATION / IRRIGATOR) ×3 IMPLANT
SET TUBE SMOKE EVAC HIGH FLOW (TUBING) ×3 IMPLANT
SLEEVE ENDOPATH XCEL 5M (ENDOMECHANICALS) ×3 IMPLANT
SPECIMEN JAR SMALL (MISCELLANEOUS) ×3 IMPLANT
SUT MNCRL AB 4-0 PS2 18 (SUTURE) ×6 IMPLANT
TOWEL OR 17X24 6PK STRL BLUE (TOWEL DISPOSABLE) ×3 IMPLANT
TOWEL OR 17X26 10 PK STRL BLUE (TOWEL DISPOSABLE) IMPLANT
TRAY LAPAROSCOPIC MC (CUSTOM PROCEDURE TRAY) ×3 IMPLANT
TROCAR XCEL BLUNT TIP 100MML (ENDOMECHANICALS) ×3 IMPLANT
TROCAR XCEL NON-BLD 11X100MML (ENDOMECHANICALS) ×3 IMPLANT
TROCAR XCEL NON-BLD 5MMX100MML (ENDOMECHANICALS) ×3 IMPLANT
WATER STERILE IRR 1000ML POUR (IV SOLUTION) ×3 IMPLANT

## 2018-10-03 NOTE — Plan of Care (Signed)

## 2018-10-03 NOTE — Op Note (Signed)
Laparoscopic Cholecystectomy  Procedure Note  Indications: This patient presents with symptomatic gallbladder disease and will undergo laparoscopic cholecystectomy. The procedure has been discussed with the patient. Operative and non operative treatments have been discussed. Risks of surgery include bleeding, infection,  Common bile duct injury,  Injury to the stomach,liver, colon,small intestine, abdominal wall,  Diaphragm,  Major blood vessels,  And the need for an open procedure.  Other risks include worsening of medical problems, death,  DVT and pulmonary embolism, and cardiovascular events.   Medical options have also been discussed. The patient has been informed of long term expectations of surgery and non surgical options,  The patient agrees to proceed.     Pre-operative Diagnosis: Calculus of gallbladder with acute cholecystitis, without mention of obstruction  Post-operative Diagnosis: Same  Surgeon: Clovis Pu Yacine Garriga   Assistants: Rayburn PA   Anesthesia: General endotracheal anesthesia and Local anesthesia 0.25.% bupivacaine, with epinephrine  ASA Class: 2  Procedure Details  The patient was seen again in the Holding Room. The risks, benefits, complications, treatment options, and expected outcomes were discussed with the patient. The possibilities of reaction to medication, pulmonary aspiration, perforation of viscus, bleeding, recurrent infection, finding a normal gallbladder, the need for additional procedures, failure to diagnose a condition, the possible need to convert to an open procedure, and creating a complication requiring transfusion or operation were discussed with the patient. The patient and/or family concurred with the proposed plan, giving informed consent. The site of surgery properly noted/marked. The patient was taken to Operating Room, identified as Tonya Ellis and the procedure verified as Laparoscopic Cholecystectomy with Intraoperative Cholangiograms. A Time Out  was held and the above information confirmed.  Prior to the induction of general anesthesia, antibiotic prophylaxis was administered. General endotracheal anesthesia was then administered and tolerated well. After the induction, the abdomen was prepped in the usual sterile fashion. The patient was positioned in the supine position with the left arm comfortably tucked, along with some reverse Trendelenburg.  Local anesthetic agent was injected into the skin near the umbilicus and an incision made. The midline fascia was incised and the Hasson technique was used to introduce a 12 mm port under direct vision. It was secured with a figure of eight Vicryl suture placed in the usual fashion. Pneumoperitoneum was then created with CO2 and tolerated well without any adverse changes in the patient's vital signs. Additional trocars were introduced under direct vision with an 11 mm trocar in the epigastrium and 2 5 mm trocars in the right upper quadrant. All skin incisions were infiltrated with a local anesthetic agent before making the incision and placing the trocars.   The gallbladder was identified, the fundus grasped and retracted cephalad. Adhesions were lysed bluntly and with the electrocautery where indicated, taking care not to injure any adjacent organs or viscus. The infundibulum was grasped and retracted laterally, exposing the peritoneum overlying the triangle of Calot. This was then divided and exposed in a blunt fashion. The cystic duct was clearly identified and bluntly dissected circumferentially. The junctions of the gallbladder, cystic duct and common bile duct were clearly identified prior to the division of any linear structure.   Critical view obtained.  Cystic duct was extremely small and would not accomadate a catheter.  Cholangiogram was not performed.    The cystic duct was then  ligated with surgical clips  on the patient side and  clipped on the gallbladder side and divided. The cystic  artery was identified, dissected free, ligated  with clips and divided as well. Posterior cystic artery clipped and divided.  The gallbladder was dissected from the liver bed in retrograde fashion with the electrocautery. The gallbladder was removed. The liver bed was irrigated and inspected. Hemostasis was achieved with the electrocautery. Copious irrigation was utilized and was repeatedly aspirated until clear all particulate matter. Hemostasis was achieved with no signs  Of bleeding or bile leakage.  Pneumoperitoneum was completely reduced after viewing removal of the trocars under direct vision. The wound was thoroughly irrigated and the fascia was then closed with a figure of eight suture; the skin was then closed with 4 0 monocryl  and a sterile dressing was applied.  Instrument, sponge, and needle counts were correct at closure and at the conclusion of the case.   Findings: Cholecystitis with Cholelithiasis  Estimated Blood Loss: less than 50 mL         Drains: NONE         Total IV Fluids: PER RECORD          Specimens: Gallbladder           Complications: None; patient tolerated the procedure well.         Disposition: PACU - hemodynamically stable.         Condition: stable

## 2018-10-03 NOTE — Interval H&P Note (Signed)
History and Physical Interval Note:  10/03/2018 1:36 PM  Alyssha Guyett  has presented today for surgery, with the diagnosis of Acute cholecystitis.  The various methods of treatment have been discussed with the patient and family. After consideration of risks, benefits and other options for treatment, the patient has consented to  Procedure(s): LAPAROSCOPIC CHOLECYSTECTOMY WITH POSSIBLE INTRAOPERATIVE CHOLANGIOGRAM (N/A) as a surgical intervention.  The patient's history has been reviewed, patient examined, no change in status, stable for surgery.  I have reviewed the patient's chart and labs.  Questions were answered to the patient's satisfaction.   The procedure has been discussed with the patient. Operative and non operative treatments have been discussed. Risks of surgery include bleeding, infection,  Common bile duct injury,  Injury to the stomach,liver, colon,small intestine, abdominal wall,  Diaphragm,  Major blood vessels,  And the need for an open procedure.  Other risks include worsening of medical problems, death,  DVT and pulmonary embolism, and cardiovascular events.   Medical options have also been discussed. The patient has been informed of long term expectations of surgery and non surgical options,  The patient agrees to proceed.     Abhay Godbolt A Rosalene Wardrop

## 2018-10-03 NOTE — Plan of Care (Signed)
  Problem: Education: Goal: Knowledge of General Education information will improve Description Including pain rating scale, medication(s)/side effects and non-pharmacologic comfort measures Outcome: Progressing   Problem: Health Behavior/Discharge Planning: Goal: Ability to manage health-related needs will improve Outcome: Progressing   Problem: Clinical Measurements: Goal: Ability to maintain clinical measurements within normal limits will improve Outcome: Progressing Goal: Will remain free from infection Outcome: Progressing Goal: Diagnostic test results will improve Outcome: Progressing   Problem: Activity: Goal: Risk for activity intolerance will decrease Outcome: Progressing   Problem: Nutrition: Goal: Adequate nutrition will be maintained Outcome: Progressing   Problem: Coping: Goal: Level of anxiety will decrease Outcome: Progressing   Problem: Elimination: Goal: Will not experience complications related to bowel motility Outcome: Progressing Goal: Will not experience complications related to urinary retention Outcome: Progressing   Problem: Pain Managment: Goal: General experience of comfort will improve Outcome: Progressing   Problem: Safety: Goal: Ability to remain free from injury will improve Outcome: Progressing   Problem: Skin Integrity: Goal: Risk for impaired skin integrity will decrease Outcome: Progressing   Problem: Clinical Measurements: Goal: Postoperative complications will be avoided or minimized Outcome: Progressing   Problem: Skin Integrity: Goal: Demonstration of wound healing without infection will improve Outcome: Progressing   

## 2018-10-03 NOTE — Transfer of Care (Signed)
Immediate Anesthesia Transfer of Care Note  Patient: Tonya Ellis  Procedure(s) Performed: LAPAROSCOPIC CHOLECYSTECTOMY (N/A Abdomen)  Patient Location: PACU  Anesthesia Type:General  Level of Consciousness: awake and alert   Airway & Oxygen Therapy: Patient Spontanous Breathing and Patient connected to nasal cannula oxygen  Post-op Assessment: Report given to RN and Post -op Vital signs reviewed and stable  Post vital signs: Reviewed and stable  Last Vitals:  Vitals Value Taken Time  BP 111/78 10/03/2018  3:14 PM  Temp 36.7 C 10/03/2018  3:15 PM  Pulse 78 10/03/2018  3:21 PM  Resp 19 10/03/2018  3:21 PM  SpO2 99 % 10/03/2018  3:21 PM  Vitals shown include unvalidated device data.  Last Pain:  Vitals:   10/03/18 1258  TempSrc:   PainSc: 0-No pain         Complications: No apparent anesthesia complications

## 2018-10-03 NOTE — Anesthesia Procedure Notes (Signed)
Procedure Name: Intubation Date/Time: 10/03/2018 1:58 PM Performed by: Inda Coke, CRNA Pre-anesthesia Checklist: Patient identified, Emergency Drugs available, Suction available and Patient being monitored Patient Re-evaluated:Patient Re-evaluated prior to induction Oxygen Delivery Method: Circle System Utilized Preoxygenation: Pre-oxygenation with 100% oxygen Induction Type: IV induction Ventilation: Mask ventilation without difficulty Laryngoscope Size: Mac and 3 Grade View: Grade I Tube type: Oral Tube size: 7.0 mm Number of attempts: 1 Airway Equipment and Method: Stylet and Oral airway Placement Confirmation: ETT inserted through vocal cords under direct vision,  positive ETCO2 and breath sounds checked- equal and bilateral Secured at: 22 cm Tube secured with: Tape Dental Injury: Teeth and Oropharynx as per pre-operative assessment

## 2018-10-03 NOTE — Discharge Instructions (Signed)
° ° °Managing Your Pain After Surgery Without Opioids ° ° ° °Thank you for participating in our program to help patients manage their pain after surgery without opioids. This is part of our effort to provide you with the best care possible, without exposing you or your family to the risk that opioids pose. ° °What pain can I expect after surgery? °You can expect to have some pain after surgery. This is normal. The pain is typically worse the day after surgery, and quickly begins to get better. °Many studies have found that many patients are able to manage their pain after surgery with Over-the-Counter (OTC) medications such as Tylenol and Motrin. If you have a condition that does not allow you to take Tylenol or Motrin, notify your surgical team. ° °How will I manage my pain? °The best strategy for controlling your pain after surgery is around the clock pain control with Tylenol (acetaminophen) and Motrin (ibuprofen or Advil). Alternating these medications with each other allows you to maximize your pain control. In addition to Tylenol and Motrin, you can use heating pads or ice packs on your incisions to help reduce your pain. ° °How will I alternate your regular strength over-the-counter pain medication? °You will take a dose of pain medication every three hours. °; Start by taking 650 mg of Tylenol (2 pills of 325 mg) °; 3 hours later take 600 mg of Motrin (3 pills of 200 mg) °; 3 hours after taking the Motrin take 650 mg of Tylenol °; 3 hours after that take 600 mg of Motrin. ° ° °- 1 - ° °See example - if your first dose of Tylenol is at 12:00 PM ° ° °12:00 PM Tylenol 650 mg (2 pills of 325 mg)  °3:00 PM Motrin 600 mg (3 pills of 200 mg)  °6:00 PM Tylenol 650 mg (2 pills of 325 mg)  °9:00 PM Motrin 600 mg (3 pills of 200 mg)  °Continue alternating every 3 hours  ° °We recommend that you follow this schedule around-the-clock for at least 3 days after surgery, or until you feel that it is no longer needed. Use  the table on the last page of this handout to keep track of the medications you are taking. °Important: °Do not take more than 3000mg of Tylenol or 3200mg of Motrin in a 24-hour period. °Do not take ibuprofen/Motrin if you have a history of bleeding stomach ulcers, severe kidney disease, &/or actively taking a blood thinner ° °What if I still have pain? °If you have pain that is not controlled with the over-the-counter pain medications (Tylenol and Motrin or Advil) you might have what we call “breakthrough” pain. You will receive a prescription for a small amount of an opioid pain medication such as Oxycodone, Tramadol, or Tylenol with Codeine. Use these opioid pills in the first 24 hours after surgery if you have breakthrough pain. Do not take more than 1 pill every 4-6 hours. ° °If you still have uncontrolled pain after using all opioid pills, don't hesitate to call our staff using the number provided. We will help make sure you are managing your pain in the best way possible, and if necessary, we can provide a prescription for additional pain medication. ° ° °Day 1   ° °Time  °Name of Medication Number of pills taken  °Amount of Acetaminophen  °Pain Level  ° °Comments  °AM PM       °AM PM       °AM PM       °  AM PM       °AM PM       °AM PM       °AM PM       °AM PM       °Total Daily amount of Acetaminophen °Do not take more than  3,000 mg per day    ° ° °Day 2   ° °Time  °Name of Medication Number of pills °taken  °Amount of Acetaminophen  °Pain Level  ° °Comments  °AM PM       °AM PM       °AM PM       °AM PM       °AM PM       °AM PM       °AM PM       °AM PM       °Total Daily amount of Acetaminophen °Do not take more than  3,000 mg per day    ° ° °Day 3   ° °Time  °Name of Medication Number of pills taken  °Amount of Acetaminophen  °Pain Level  ° °Comments  °AM PM       °AM PM       °AM PM       °AM PM       ° ° ° °AM PM       °AM PM       °AM PM       °AM PM       °Total Daily amount of Acetaminophen °Do  not take more than  3,000 mg per day    ° ° °Day 4   ° °Time  °Name of Medication Number of pills taken  °Amount of Acetaminophen  °Pain Level  ° °Comments  °AM PM       °AM PM       °AM PM       °AM PM       °AM PM       °AM PM       °AM PM       °AM PM       °Total Daily amount of Acetaminophen °Do not take more than  3,000 mg per day    ° ° °Day 5   ° °Time  °Name of Medication Number °of pills taken  °Amount of Acetaminophen  °Pain Level  ° °Comments  °AM PM       °AM PM       °AM PM       °AM PM       °AM PM       °AM PM       °AM PM       °AM PM       °Total Daily amount of Acetaminophen °Do not take more than  3,000 mg per day    ° ° ° °Day 6   ° °Time  °Name of Medication Number of pills °taken  °Amount of Acetaminophen  °Pain Level  °Comments  °AM PM       °AM PM       °AM PM       °AM PM       °AM PM       °AM PM       °AM PM       °AM PM       °Total Daily amount of Acetaminophen °Do not take more than    3,000 mg per day    ° ° °Day 7   ° °Time  °Name of Medication Number of pills taken  °Amount of Acetaminophen  °Pain Level  ° °Comments  °AM PM       °AM PM       °AM PM       °AM PM       °AM PM       °AM PM       °AM PM       °AM PM       °Total Daily amount of Acetaminophen °Do not take more than  3,000 mg per day    ° ° ° ° °For additional information about how and where to safely dispose of unused opioid °medications - https://www.morepowerfulnc.org ° °Disclaimer: This document contains information and/or instructional materials adapted from [facility] for the typical patient with your condition. It does not replace medical advice from your health care provider because your experience may differ from that of the °typical patient. Talk to your health care provider if you have any questions about this °document, your condition or your treatment plan. °Adapted from [facility] ° °[facility] °LAPAROSCOPIC SURGERY: POST OP INSTRUCTIONS °Always review your discharge  instruction sheet given to you by the facility where your surgery was performed. °IF YOU HAVE DISABILITY OR FAMILY LEAVE FORMS, YOU MUST BRING THEM TO THE OFFICE FOR PROCESSING.   °DO NOT GIVE THEM TO YOUR DOCTOR. ° °PAIN CONTROL ° °1. First take acetaminophen (Tylenol) AND/or ibuprofen (Advil) to control your pain after surgery.  Follow directions on package.  Taking acetaminophen (Tylenol) and/or ibuprofen (Advil) regularly after surgery will help to control your pain and lower the amount of prescription pain medication you may need.  You should not take more than 3,000 mg (3 grams) of acetaminophen (Tylenol) in 24 hours.  You should not take ibuprofen (Advil), aleve, motrin, naprosyn or other NSAIDS if you have a history of stomach ulcers or chronic kidney disease.  °2. A prescription for pain medication may be given to you upon discharge.  Take your pain medication as prescribed, if you still have uncontrolled pain after taking acetaminophen (Tylenol) or ibuprofen (Advil). °3. Use ice packs to help control pain. °4. If you need a refill on your pain medication, please contact your pharmacy.  They will contact our office to request authorization. Prescriptions will not be filled after 5pm or on week-ends. ° °HOME MEDICATIONS °5. Take your usually prescribed medications unless otherwise directed. ° °DIET °6. You should follow a light diet the first few days after arrival home.  Be sure to include lots of fluids daily. Avoid fatty, fried foods.  ° °CONSTIPATION °7. It is common to experience some constipation after surgery and if you are taking pain medication.  Increasing fluid intake and taking a stool softener (such as Colace) will usually help or prevent this problem from occurring.  A mild laxative (Milk of Magnesia or Miralax) should be taken according to package instructions if there are no bowel movements after 48 hours. ° °WOUND/INCISION CARE °8. Most patients will experience some swelling and bruising in  the area of the incisions.  Ice packs will help.  Swelling and bruising can take several days to resolve.  °9. Unless discharge instructions indicate otherwise, follow guidelines below  °a. STERI-STRIPS - you may remove your outer bandages 48 hours after surgery, and you may shower at that time.  You have steri-strips (  small skin tapes) in place directly over the incision.  These strips should be left on the skin for 7-10 days.   °b. DERMABOND/SKIN GLUE - you may shower in 24 hours.  The glue will flake off over the next 2-3 weeks. °10. Any sutures or staples will be removed at the office during your follow-up visit. ° °ACTIVITIES °11. You may resume regular (light) daily activities beginning the next day--such as daily self-care, walking, climbing stairs--gradually increasing activities as tolerated.  You may have sexual intercourse when it is comfortable.  Refrain from any heavy lifting or straining until approved by your doctor. °a. You may drive when you are no longer taking prescription pain medication, you can comfortably wear a seatbelt, and you can safely maneuver your car and apply brakes. ° °FOLLOW-UP °12. You should see your doctor in the office for a follow-up appointment approximately 2-3 weeks after your surgery.  You should have been given your post-op/follow-up appointment when your surgery was scheduled.  If you did not receive a post-op/follow-up appointment, make sure that you call for this appointment within a day or two after you arrive home to insure a convenient appointment time. ° ° °WHEN TO CALL YOUR DOCTOR: °1. Fever over 101.0 °2. Inability to urinate °3. Continued bleeding from incision. °4. Increased pain, redness, or drainage from the incision. °5. Increasing abdominal pain ° °The clinic staff is available to answer your questions during regular business hours.  Please don’t hesitate to call and ask to speak to one of the nurses for clinical concerns.  If you have a medical emergency,  go to the nearest emergency room or call 911.  A surgeon from [facility] is always on call at the hospital. °[address], [address], [place], [place]  [address] ? [address] [address], [place], [place]   [address] °[phone] ? [phone] ? FAX [phone] °Web site: [email or web address] ° °

## 2018-10-03 NOTE — Anesthesia Preprocedure Evaluation (Signed)
Anesthesia Evaluation  Patient identified by MRN, date of birth, ID band Patient awake    Reviewed: Allergy & Precautions, NPO status , Patient's Chart, lab work & pertinent test results  Airway Mallampati: II  TM Distance: >3 FB Neck ROM: Full    Dental no notable dental hx.    Pulmonary neg pulmonary ROS,    Pulmonary exam normal breath sounds clear to auscultation       Cardiovascular negative cardio ROS Normal cardiovascular exam Rhythm:Regular Rate:Normal     Neuro/Psych negative neurological ROS  negative psych ROS   GI/Hepatic negative GI ROS, Neg liver ROS,   Endo/Other  negative endocrine ROS  Renal/GU negative Renal ROS  negative genitourinary   Musculoskeletal negative musculoskeletal ROS (+)   Abdominal   Peds negative pediatric ROS (+)  Hematology negative hematology ROS (+)   Anesthesia Other Findings   Reproductive/Obstetrics negative OB ROS                             Anesthesia Physical Anesthesia Plan  ASA: II  Anesthesia Plan: General   Post-op Pain Management:    Induction:   PONV Risk Score and Plan: Treatment may vary due to age or medical condition, Ondansetron and Dexamethasone  Airway Management Planned: Oral ETT  Additional Equipment:   Intra-op Plan:   Post-operative Plan: Extubation in OR  Informed Consent: I have reviewed the patients History and Physical, chart, labs and discussed the procedure including the risks, benefits and alternatives for the proposed anesthesia with the patient or authorized representative who has indicated his/her understanding and acceptance.     Dental advisory given  Plan Discussed with: CRNA  Anesthesia Plan Comments:         Anesthesia Quick Evaluation

## 2018-10-04 ENCOUNTER — Encounter (HOSPITAL_COMMUNITY): Payer: Self-pay | Admitting: Surgery

## 2018-10-04 MED ORDER — IBUPROFEN 200 MG PO TABS: 600.0000 mg | ORAL_TABLET | Freq: Four times a day (QID) | ORAL | Status: AC | PRN

## 2018-10-04 MED ORDER — ACETAMINOPHEN 325 MG PO TABS: 650.0000 mg | ORAL_TABLET | Freq: Four times a day (QID) | ORAL | Status: AC | PRN

## 2018-10-04 NOTE — Anesthesia Postprocedure Evaluation (Signed)
Anesthesia Post Note  Patient: Tonya Ellis  Procedure(s) Performed: LAPAROSCOPIC CHOLECYSTECTOMY (N/A Abdomen)     Patient location during evaluation: PACU Anesthesia Type: General Level of consciousness: awake and alert Pain management: pain level controlled Vital Signs Assessment: post-procedure vital signs reviewed and stable Respiratory status: spontaneous breathing, nonlabored ventilation, respiratory function stable and patient connected to nasal cannula oxygen Cardiovascular status: blood pressure returned to baseline and stable Postop Assessment: no apparent nausea or vomiting Anesthetic complications: no    Last Vitals:  Vitals:   10/04/18 0554 10/04/18 0949  BP: (!) 91/51 104/73  Pulse: 79 71  Resp: 18 17  Temp: 36.9 C 36.9 C  SpO2: 100% 100%    Last Pain:  Vitals:   10/04/18 1015  TempSrc:   PainSc: 2                  Trevor Iha

## 2018-10-04 NOTE — Progress Notes (Signed)
Patient discharged to home with instructions. 

## 2018-10-04 NOTE — Discharge Summary (Signed)
[facility] Discharge Summary   Patient ID: Tonya Ellis MRN: [id number] DOB/AGE: January 05, 1991 28 y.o.  Admit date: 10/02/2018 Discharge date: 10/04/2018  Admitting Diagnosis: Acute cholecystitis  Discharge Diagnosis Acute cholecystitis  Consultants None  Imaging: Ct Abdomen Pelvis W Contrast  Result Date: 10/02/2018 CLINICAL DATA:  28 year old female with abdominal pain radiating to the back since 0900 hours with severe nausea. EXAM: CT ABDOMEN AND PELVIS WITH CONTRAST TECHNIQUE: Multidetector CT imaging of the abdomen and pelvis was performed using the standard protocol following bolus administration of intravenous contrast. CONTRAST:  OMNIPAQUE IOHEXOL 300 MG/ML  SOLN COMPARISON:  Abdominal radiographs 04/03/2013. FINDINGS: Lower chest: Negative. Hepatobiliary: Questionable indistinct appearance of the gallbladder wall and perhaps trace pericholecystic inflammation (coronal image 60). No hyper enhancement of the adjacent liver parenchyma. No cholelithiasis is evident. Negative liver. No bile duct enlargement. Pancreas: Negative. Spleen: Negative. Adrenals/Urinary Tract: Normal adrenal glands. Symmetric and normal bilateral renal enhancement. No hydronephrosis. Proximal ureters are decompressed. No nephrolithiasis identified. Unremarkable urinary bladder. Stomach/Bowel: Negative large bowel aside from mild retained stool, redundant sigmoid. Normal appendix on coronal image 60. negative terminal ileum. No dilated small bowel. Negative stomach. No mesenteric inflammatory stranding. No free air, free fluid. Vascular/Lymphatic: Major arterial structures are patent and appear normal. Portal venous system is patent. Reproductive: Within normal limits. Other: No pelvic free fluid. Musculoskeletal: Negative. IMPRESSION: 1. Questionable gallbladder inflammation. Consider Acute Cholecystitis in this clinical setting and recommend Right Upper Quadrant Ultrasound to evaluate further. 2.  Otherwise negative CT Abdomen and Pelvis.  Normal appendix. Electronically Signed   By: Odessa Fleming M.D.   On: 10/02/2018 17:42   US Abdomen Limited Ruq  Result Date: 10/02/2018 CLINICAL DATA:  Abdominal pain. EXAM: ULTRASOUND ABDOMEN LIMITED RIGHT UPPER QUADRANT COMPARISON:  10/02/2018 CT. FINDINGS: Gallbladder: 1.8 cm gallstone within the neck. Wall thickening at 6 mm. Suggestion of wall edema on image 3. Sonographic Murphy's sign was not elicited. Common bile duct: Diameter: Normal, 3 mm. Liver: No focal lesion identified. Within normal limits in parenchymal echogenicity. Portal vein is patent on color Doppler imaging with normal direction of blood flow towards the liver. IMPRESSION: Cholelithiasis with gallbladder wall thickening and suggestion of edema. No sonographic Murphy's sign to confirm acute cholecystitis. If this remains a clinical concern, consider nuclear medicine hepatobiliary study. Electronically Signed   By: Jeronimo Greaves M.D.   On: 10/02/2018 18:58    Procedures Dr. Luisa Hart (10/03/18) - Laparoscopic Cholecystectomy  Hospital Course:  Patient is a 28 year old female who presented to [facility] with abdominal pain.  Workup showed acute cholecystitis.  Patient was admitted and underwent procedure listed above.  Tolerated procedure well and was transferred to the floor.  Diet was advanced as tolerated.  On POD#1, the patient was voiding well, tolerating diet, ambulating well, pain well controlled, vital signs stable, incisions c/d/i and felt stable for discharge home.  Patient will follow up in our office in 2 weeks and knows to call with questions or concerns. She will call to confirm appointment date/time.    Physical Exam: General:  Alert, NAD, pleasant, comfortable Abd:  Soft, ND, mild tenderness, incisions C/D/I  Allergies as of 10/04/2018   No Known Allergies     Medication List    STOP taking these medications   azithromycin 250 MG tablet Commonly known as:  ZITHROMAX     TAKE  these medications   acetaminophen 325 MG tablet Commonly known as:  TYLENOL Take 2 tablets (650 mg total) by mouth every  6 (six) hours as needed for mild pain (or temp > 100).   drospirenone-ethinyl estradiol 3-0.02 MG tablet Commonly known as:  YAZ,GIANVI,LORYNA Take 1 tablet by mouth daily. What changed:  Another medication with the same name was removed. Continue taking this medication, and follow the directions you see here.   ibuprofen 200 MG tablet Commonly known as:  ADVIL,MOTRIN Take 3 tablets (600 mg total) by mouth every 6 (six) hours as needed for moderate pain.   loratadine 10 MG tablet Commonly known as:  CLARITIN Take 10 mg by mouth daily.   metFORMIN 500 MG tablet Commonly known as:  GLUCOPHAGE Take 2 tablets (1,000 mg total) by mouth 2 (two) times daily with a meal.   MULTIVITAMIN PO Take 1 tablet by mouth daily.   sertraline 25 MG tablet Commonly known as:  ZOLOFT Take 25 mg by mouth daily.   valACYclovir 500 MG tablet Commonly known as:  VALTREX Take 1 tablet by mouth twice daily for 3 days for outbreak; take 1 tablet every day for suppression        Follow-up Information    [facility]. Go on 10/17/2018.   Specialty:  General Surgery Why:  Follow up appointment scheduled for 1:30 PM. Please arrive 30 min prior to appointment time. Bring photo ID and insurance information.  Contact information: [address] [place] [address] [phone]           Signed: Wells Guiles, [facility] 10/04/2018, 10:53 AM Pager: [phone] Consults: [phone]

## 2019-02-12 ENCOUNTER — Other Ambulatory Visit: Payer: Self-pay | Admitting: Obstetrics and Gynecology

## 2019-04-09 ENCOUNTER — Encounter (HOSPITAL_BASED_OUTPATIENT_CLINIC_OR_DEPARTMENT_OTHER): Payer: Self-pay

## 2019-04-09 ENCOUNTER — Ambulatory Visit (HOSPITAL_BASED_OUTPATIENT_CLINIC_OR_DEPARTMENT_OTHER): Admit: 2019-04-09 | Payer: PRIVATE HEALTH INSURANCE | Admitting: Obstetrics and Gynecology

## 2019-04-09 SURGERY — LIGATION, FALLOPIAN TUBE, LAPAROSCOPIC
Anesthesia: General | Laterality: Bilateral

## 2021-01-21 IMAGING — US ULTRASOUND ABDOMEN LIMITED
1 series · 14 of 21 positions shown · non-contrast
Comparison: 10/02/2018 CT.

CLINICAL DATA: Abdominal pain.

EXAM:
ULTRASOUND ABDOMEN LIMITED RIGHT UPPER QUADRANT

[Series 1: ultrasound abdomen limited · 14 of 21 slices shown]
[im 1/21]
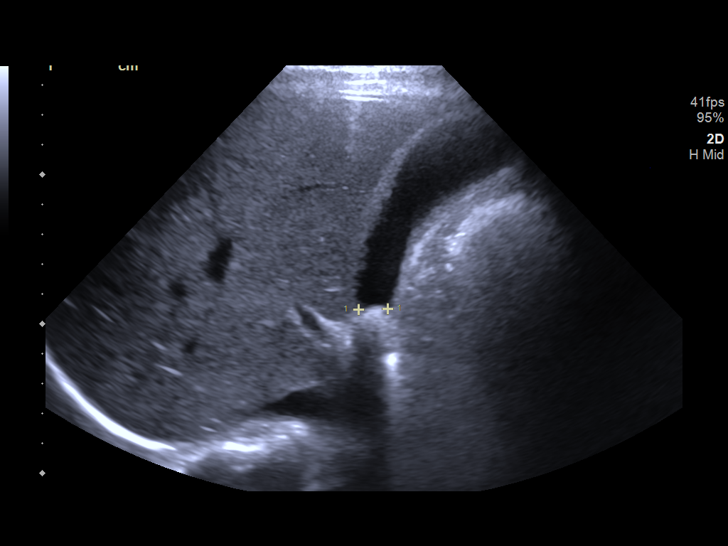
[im 3/21]
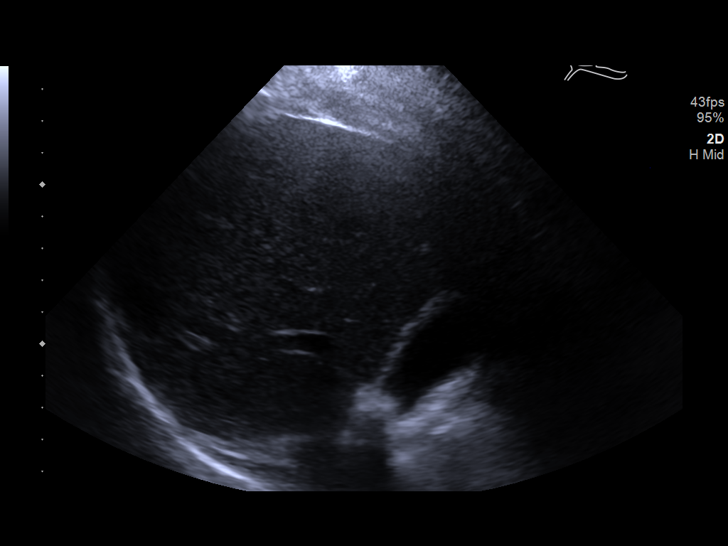
[im 4/21]
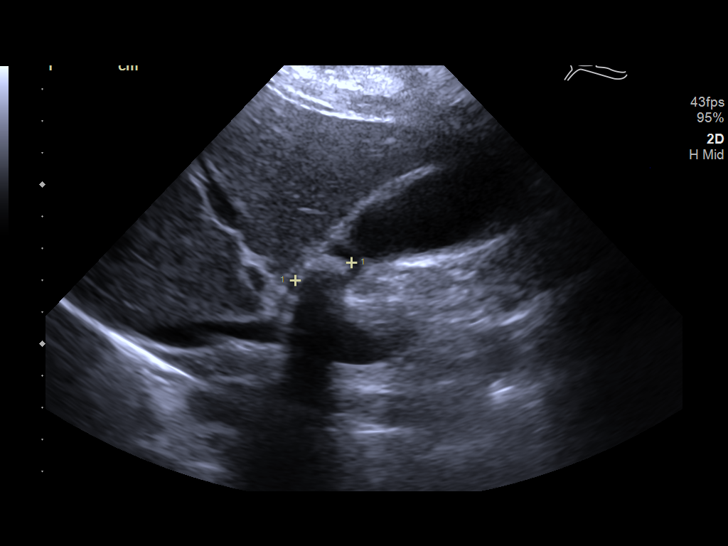
[im 6/21]
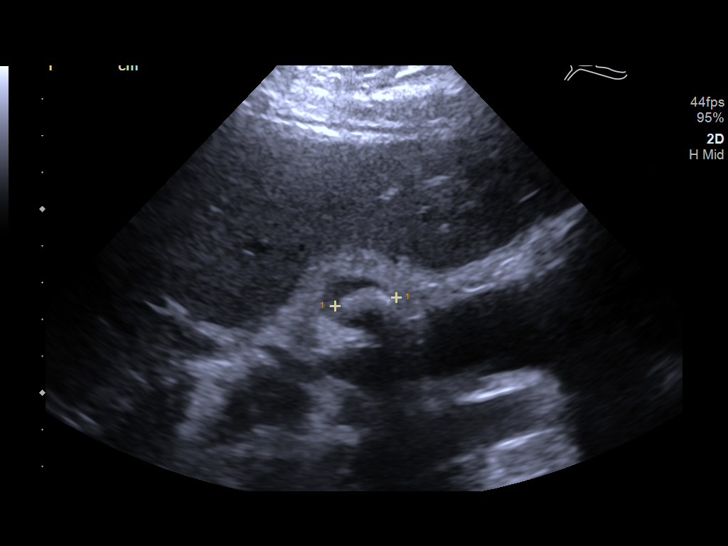
[im 7/21]
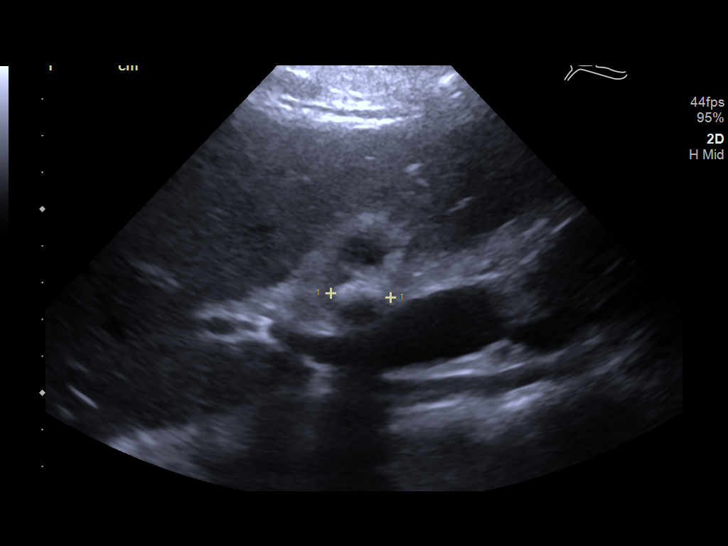
[im 9/21]
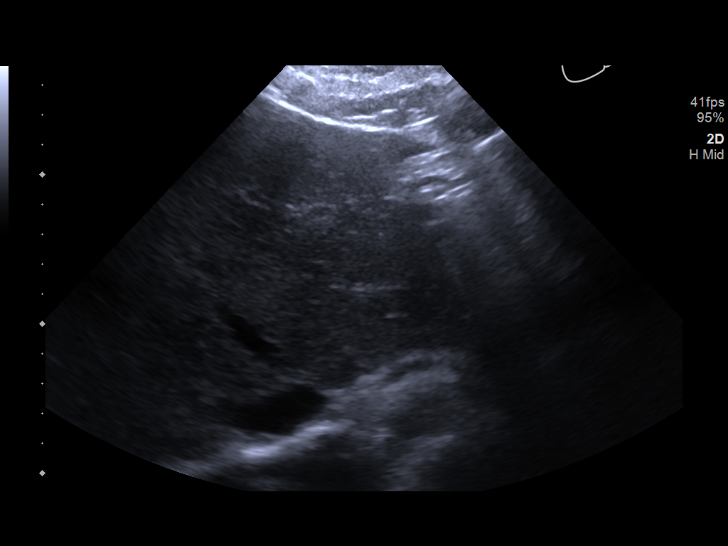
[im 10/21]
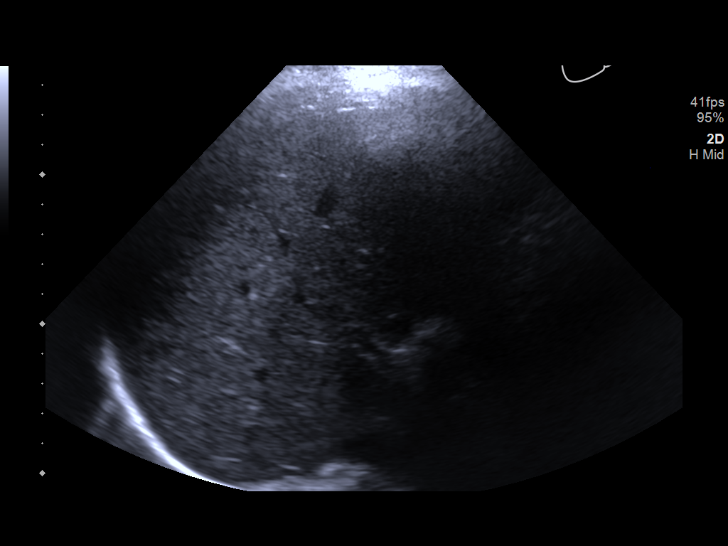
[im 12/21]
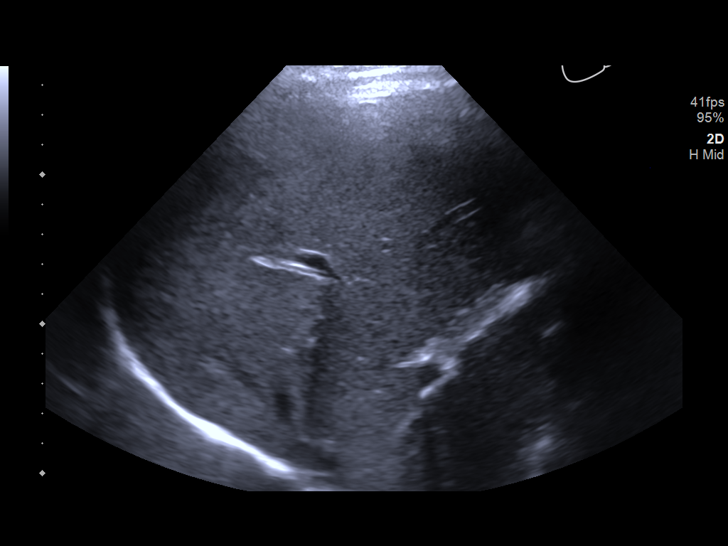
[im 13/21]
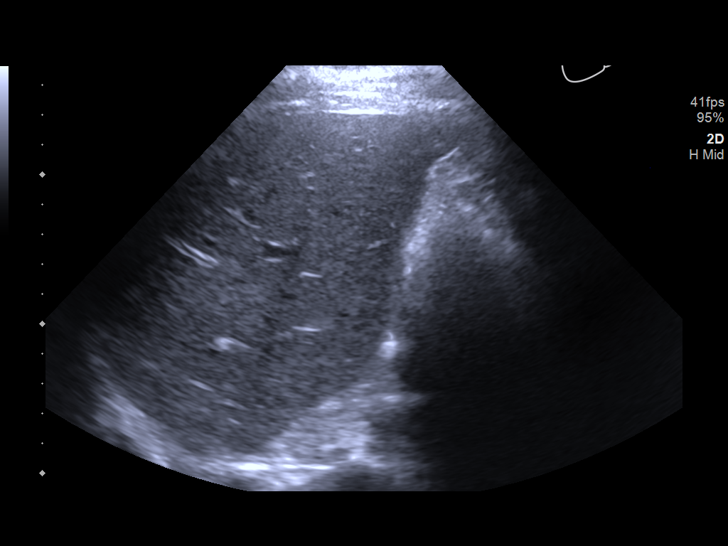
[im 15/21]
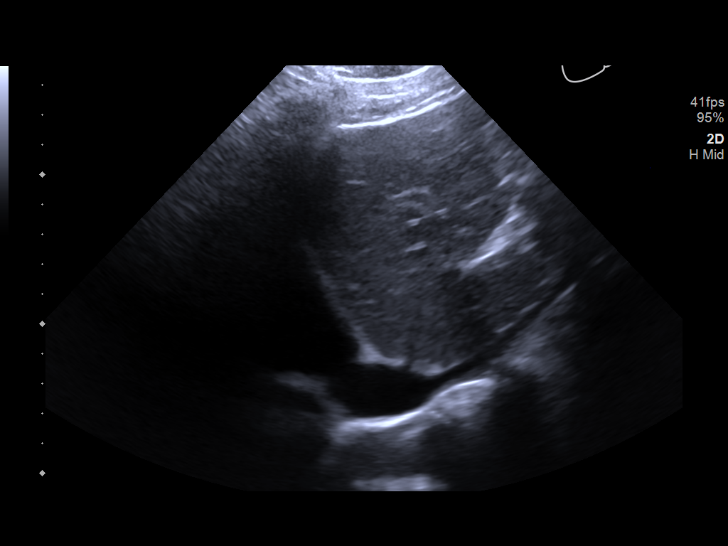
[im 16/21]
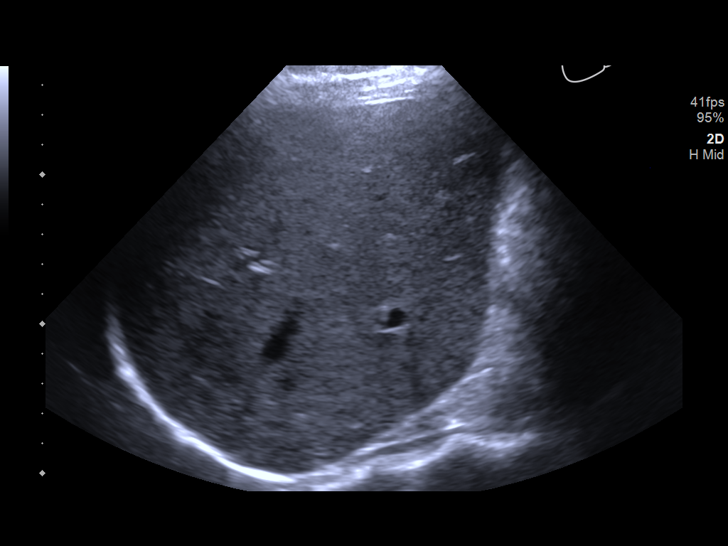
[im 18/21]
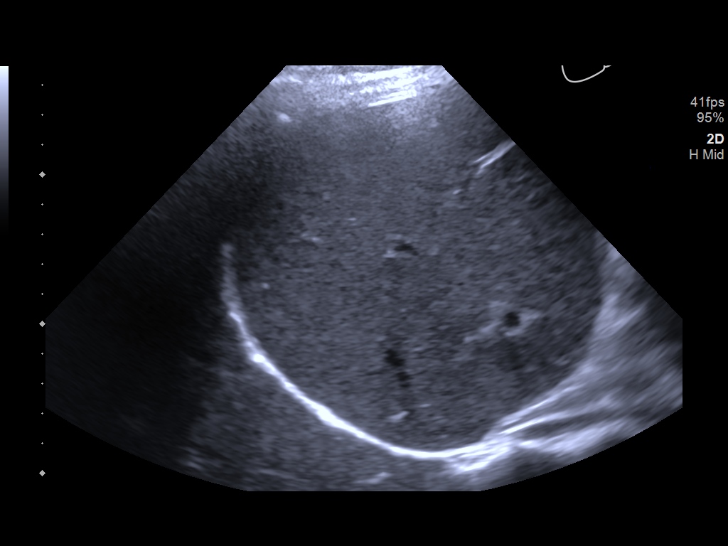
[im 19/21]
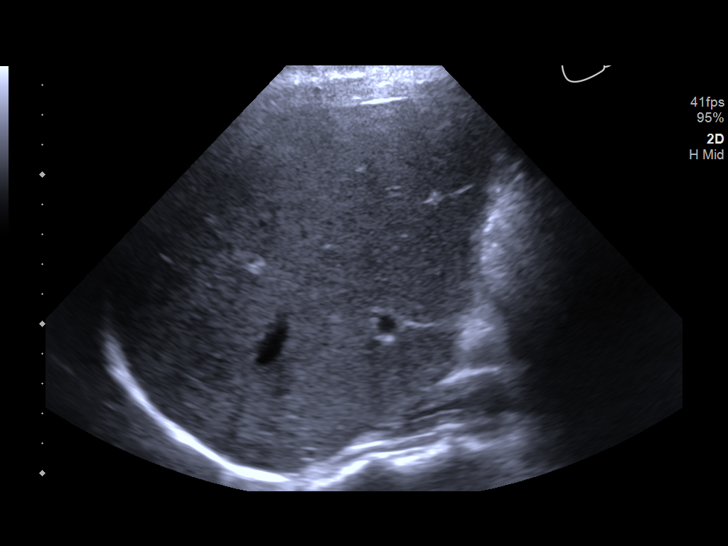
[im 21/21]
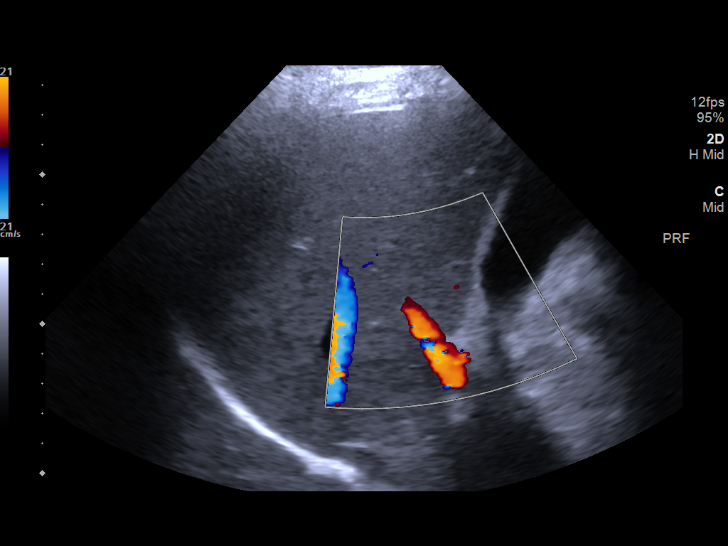

[14 of 21 positions shown; findings below may reference images not displayed]

FINDINGS: Gallbladder:

1.8 cm gallstone within the neck. Wall thickening at 6 mm.
Suggestion of wall edema on image 3. Sonographic Murphy's sign was
not elicited.

Common bile duct:

Diameter: Normal, 3 mm.

Liver:

No focal lesion identified. Within normal limits in parenchymal
echogenicity. Portal vein is patent on color Doppler imaging with
normal direction of blood flow towards the liver.
IMPRESSION: Cholelithiasis with gallbladder wall thickening and suggestion of
edema. No sonographic Murphy's sign to confirm acute cholecystitis.
If this remains a clinical concern, consider nuclear medicine
hepatobiliary study.
